# Patient Record
Sex: Female | Born: 1970 | Race: White | Hispanic: No | Smoking: Current every day smoker
Health system: Southern US, Community
[De-identification: ages and names within clinical notes are randomized; demographics above are authoritative.]

## PROBLEM LIST (undated history)

## (undated) DIAGNOSIS — N809 Endometriosis, unspecified: Secondary | ICD-10-CM

## (undated) DIAGNOSIS — C801 Malignant (primary) neoplasm, unspecified: Secondary | ICD-10-CM

## (undated) DIAGNOSIS — C539 Malignant neoplasm of cervix uteri, unspecified: Secondary | ICD-10-CM

## (undated) DIAGNOSIS — J45909 Unspecified asthma, uncomplicated: Secondary | ICD-10-CM

## (undated) DIAGNOSIS — O009 Unspecified ectopic pregnancy without intrauterine pregnancy: Secondary | ICD-10-CM

## (undated) DIAGNOSIS — D219 Benign neoplasm of connective and other soft tissue, unspecified: Secondary | ICD-10-CM

## (undated) DIAGNOSIS — T8859XA Other complications of anesthesia, initial encounter: Secondary | ICD-10-CM

## (undated) HISTORY — PX: CARPAL TUNNEL RELEASE: SHX101

## (undated) HISTORY — PX: TONSILLECTOMY: SUR1361

---

## 2012-10-12 ENCOUNTER — Emergency Department (HOSPITAL_COMMUNITY)
Admission: EM | Admit: 2012-10-12 | Discharge: 2012-10-12 | Disposition: A | Discharge status: Home / Self Care | Payer: Self-pay | Attending: Emergency Medicine | Admitting: Emergency Medicine

## 2012-10-12 ENCOUNTER — Emergency Department (HOSPITAL_COMMUNITY): Payer: Self-pay

## 2012-10-12 ENCOUNTER — Encounter (HOSPITAL_COMMUNITY): Payer: Self-pay | Admitting: Vascular Surgery

## 2012-10-12 DIAGNOSIS — Y9329 Activity, other involving ice and snow: Secondary | ICD-10-CM | POA: Insufficient documentation

## 2012-10-12 DIAGNOSIS — M47816 Spondylosis without myelopathy or radiculopathy, lumbar region: Secondary | ICD-10-CM

## 2012-10-12 DIAGNOSIS — J45909 Unspecified asthma, uncomplicated: Secondary | ICD-10-CM | POA: Insufficient documentation

## 2012-10-12 DIAGNOSIS — Z87891 Personal history of nicotine dependence: Secondary | ICD-10-CM | POA: Insufficient documentation

## 2012-10-12 DIAGNOSIS — M51379 Other intervertebral disc degeneration, lumbosacral region without mention of lumbar back pain or lower extremity pain: Secondary | ICD-10-CM | POA: Insufficient documentation

## 2012-10-12 DIAGNOSIS — M5137 Other intervertebral disc degeneration, lumbosacral region: Secondary | ICD-10-CM | POA: Insufficient documentation

## 2012-10-12 DIAGNOSIS — Y929 Unspecified place or not applicable: Secondary | ICD-10-CM | POA: Insufficient documentation

## 2012-10-12 DIAGNOSIS — W010XXA Fall on same level from slipping, tripping and stumbling without subsequent striking against object, initial encounter: Secondary | ICD-10-CM | POA: Insufficient documentation

## 2012-10-12 DIAGNOSIS — W108XXA Fall (on) (from) other stairs and steps, initial encounter: Secondary | ICD-10-CM | POA: Insufficient documentation

## 2012-10-12 DIAGNOSIS — IMO0002 Reserved for concepts with insufficient information to code with codable children: Secondary | ICD-10-CM | POA: Insufficient documentation

## 2012-10-12 HISTORY — DX: Unspecified asthma, uncomplicated: J45.909

## 2012-10-12 HISTORY — DX: Unspecified ectopic pregnancy without intrauterine pregnancy: O00.90

## 2012-10-12 MED ORDER — CYCLOBENZAPRINE HCL 10 MG PO TABS: 10.0000 mg | ORAL_TABLET | Freq: Two times a day (BID) | ORAL | Status: DC | PRN

## 2012-10-12 MED ORDER — NAPROXEN 500 MG PO TABS: 500.0000 mg | ORAL_TABLET | Freq: Two times a day (BID) | ORAL | Status: DC

## 2012-10-12 MED ORDER — HYDROMORPHONE HCL PF 1 MG/ML IJ SOLN
1.0000 mg | Freq: Once | INTRAMUSCULAR | Status: AC
  Administered 2012-10-12: 1 mg via INTRAVENOUS
  Filled 2012-10-12: qty 1

## 2012-10-12 MED ORDER — ONDANSETRON HCL 4 MG/2ML IJ SOLN
4.0000 mg | Freq: Once | INTRAMUSCULAR | Status: AC
  Administered 2012-10-12: 4 mg via INTRAVENOUS
  Filled 2012-10-12: qty 2

## 2012-10-12 MED ORDER — HYDROCODONE-ACETAMINOPHEN 5-325 MG PO TABS: 1.0000 | ORAL_TABLET | Freq: Four times a day (QID) | ORAL | Status: DC | PRN

## 2012-10-12 NOTE — ED Notes (Addendum)
Pt reports to the ED via [facility] for lower back and upper left leg pain following a fall down 8 stairs. Pt reports the pain radiates from her lower back into bilateral legs. Pt also reports numbness and tingling in the right leg. Pt denies head injury, syncope, or LOC. Pt reports her left leg was bent backwards. CMS and full ROM intact. A&O x4. Pt reports 10/10 pain. Pt appears tearful and anxious. Pt also reports some intermittent left hand numbness. Pt reports that she has hx of carpal tunnel and she has had problems with that before.

## 2012-10-12 NOTE — ED Notes (Signed)
Pt taken to xray 

## 2012-10-12 NOTE — ED Provider Notes (Signed)
History     CSN: [id number]  Arrival date & time 10/12/12  1102   First MD Initiated Contact with Patient 10/12/12 1105      Chief Complaint  Patient presents with  . Fall    (Consider location/radiation/quality/duration/timing/severity/associated sxs/prior treatment) HPI 42 yo F presents to ED via EMS following a slip on ice and fall down 8 stairs that occurred this AM outside of her mother's home. She reports hitting her lower and mid back on the stairs as well as hyperflexion of her L knee. She denies head trauma and loss on consciousness. Her low back pain is L sided, severe, non radiating. Worse with hip flexion. He knee pain is lateral, moderate, worse with palpation. She admits to numbness on the lateral aspect of the distal L toe. She denies groin numbness.   She denies neck pain. She admits to intermittent L hand numbness. She has a history of carpal tunnel and has experienced this numbness before.   Past Medical History  Diagnosis Date  . Asthma   . Ectopic pregnancy    Past Surgical History  Procedure Laterality Date  . Carpal tunnel release     History reviewed. No pertinent family history.  History  Substance Use Topics  . Smoking status: Former Games developer  . Smokeless tobacco: Not on file  . Alcohol Use: Yes     Comment: occasionally   OB History   Grav Para Term Preterm Abortions TAB SAB Ect Mult Living                 Review of Systems Negative except as per HPI.  Allergies  Codeine  Home Medications   Current Outpatient Rx  Name  Route  Sig  Dispense  Refill  . ketotifen (ALLERGY EYE DROPS) 0.025 % ophthalmic solution   Both Eyes   Place 1 drop into both eyes 2 (two) times daily as needed (for allergies).           BP 128/84  Pulse 71  Temp(Src) 98.3 F (36.8 C) (Oral)  Resp 20  SpO2 100%  LMP 10/05/2012  Physical Exam General appearance: alert, cooperative and mild distress Back:  Back Exam: Inspection: Motion: SLR lying: - XSLR  lying: -  Seated HS Flexibility: full  Palpable tenderness:  FABER: + on L, negative on R  Sensory change: numbness R lateral distal toe only Reflex change:   Strength at L foot Plantar-flexion:  5/ 5    Dorsi-flexion:  5/ 5    Eversion: 5/ 5   Inversion: 5 / 5 Leg strength Quad: 5 / 5   Hamstring:  5/ 5   Hip flexor:  5/ 5   Hip abductors: 5 / 5  Strength at R foot Plantar-flexion:  5/ 5    Dorsi-flexion:  5/ 5    Eversion: 5/ 5   Inversion: 5 / 5 Leg strength Quad: 5 / 5   Hamstring:  5/ 5   Hip flexor:  5/ 5   Hip abductors: 5 / 5  Gait Walking:          Heels:           Toes:            Tandem:  L Knee: noticeable swelling along anterior lateral quadricep and    ED Course  Procedures (including critical care time)  Labs Reviewed - No data to display Dg Lumbar Spine Complete  10/12/2012  *RADIOLOGY REPORT*  Clinical Data: Pain post trauma  LUMBAR SPINE - COMPLETE 4+ VIEW  Comparison: None.  Findings:  Frontal, lateral, spot lumbosacral lateral, bilateral oblique views were obtained.  There are five non-rib bearing lumbar type vertebral bodies. There is mild lower lumbar levorotoscoliosis.  There is no fracture or spondylolisthesis. There is marked disc space narrowing at L4-5 and L5-S1.  There is milder narrowing at L3-L4.  There is facet osteoarthritic change at L4-5 and L5-S1 bilaterally.  IMPRESSION: Osteoarthritic change.  No fracture or spondylolisthesis. Mild lower lumbar scoliosis.   Original Report Authenticated By: Bretta Bang, M.D.    Dg Knee Complete 4 Views Left  10/12/2012  *RADIOLOGY REPORT*  Clinical Data: Status post fall.  Pain.  LEFT KNEE - COMPLETE 4+ VIEW  Comparison: Plain films 04/15/2007.  Findings: Imaged bones, joints and soft tissues appear normal.  IMPRESSION: Normal study.   Original Report Authenticated By: Holley Dexter, M.D.     Reviewed x-rays with the patient.   No diagnosis found.   MDM   Fall on ice. Low back and L thigh  contusion. No fracture or dislocation.  Discharge to home with vicodin, NSAID and muscle relaxer for pain control. Also advised regular icing and use of cushion.  Recommended patient establish primary care near home.          Dessa Phi, MD 10/12/12 1418

## 2012-10-12 NOTE — ED Provider Notes (Signed)
Medical screening examination/treatment/procedure(s) were performed by non-physician practitioner and as supervising physician I was immediately available for consultation/collaboration.   Carleene Cooper III, MD 10/12/12 2008

## 2017-06-05 ENCOUNTER — Encounter (HOSPITAL_COMMUNITY): Payer: Self-pay

## 2017-06-05 ENCOUNTER — Inpatient Hospital Stay (HOSPITAL_COMMUNITY)
Admission: AD | Admit: 2017-06-05 | Discharge: 2017-06-05 | Disposition: A | Discharge status: Home / Self Care | Payer: Self-pay | Source: Ambulatory Visit | Attending: Obstetrics & Gynecology | Admitting: Obstetrics & Gynecology

## 2017-06-05 DIAGNOSIS — D500 Iron deficiency anemia secondary to blood loss (chronic): Secondary | ICD-10-CM | POA: Insufficient documentation

## 2017-06-05 DIAGNOSIS — Z8541 Personal history of malignant neoplasm of cervix uteri: Secondary | ICD-10-CM | POA: Insufficient documentation

## 2017-06-05 DIAGNOSIS — D649 Anemia, unspecified: Secondary | ICD-10-CM | POA: Diagnosis present

## 2017-06-05 DIAGNOSIS — N939 Abnormal uterine and vaginal bleeding, unspecified: Secondary | ICD-10-CM | POA: Diagnosis present

## 2017-06-05 DIAGNOSIS — Z3202 Encounter for pregnancy test, result negative: Secondary | ICD-10-CM | POA: Insufficient documentation

## 2017-06-05 DIAGNOSIS — Z87891 Personal history of nicotine dependence: Secondary | ICD-10-CM | POA: Insufficient documentation

## 2017-06-05 HISTORY — DX: Benign neoplasm of connective and other soft tissue, unspecified: D21.9

## 2017-06-05 HISTORY — DX: Endometriosis, unspecified: N80.9

## 2017-06-05 HISTORY — DX: Malignant (primary) neoplasm, unspecified: C80.1

## 2017-06-05 LAB — URINALYSIS, ROUTINE W REFLEX MICROSCOPIC
BILIRUBIN URINE: NEGATIVE
Glucose, UA: NEGATIVE mg/dL
Ketones, ur: NEGATIVE mg/dL
NITRITE: POSITIVE — AB
Protein, ur: 100 mg/dL — AB
Specific Gravity, Urine: <1.005 — ABNORMAL LOW (ref 1.005–1.030)
pH: 8.5 — ABNORMAL HIGH (ref 5.0–8.0)

## 2017-06-05 LAB — CBC
HCT: 23.1 % — ABNORMAL LOW (ref 36.0–46.0)
Hemoglobin: 7.4 g/dL — ABNORMAL LOW (ref 12.0–15.0)
MCH: 26.1 pg (ref 26.0–34.0)
MCHC: 32 g/dL (ref 30.0–36.0)
MCV: 81.3 fL (ref 78.0–100.0)
PLATELETS: 317 10*3/uL (ref 150–400)
RBC: 2.84 MIL/uL — ABNORMAL LOW (ref 3.87–5.11)
RDW: 21.1 % — AB (ref 11.5–15.5)
WBC: 9.4 10*3/uL (ref 4.0–10.5)

## 2017-06-05 LAB — URINALYSIS, MICROSCOPIC (REFLEX)
BACTERIA UA: NONE SEEN
WBC UA: NONE SEEN WBC/hpf (ref 0–5)

## 2017-06-05 LAB — WET PREP, GENITAL
CLUE CELLS WET PREP: NONE SEEN
Sperm: NONE SEEN
TRICH WET PREP: NONE SEEN
WBC, Wet Prep HPF POC: NONE SEEN
YEAST WET PREP: NONE SEEN

## 2017-06-05 LAB — POCT PREGNANCY, URINE: Preg Test, Ur: NEGATIVE

## 2017-06-05 MED ORDER — MEGESTROL ACETATE 40 MG PO TABS: 40.0000 mg | ORAL_TABLET | Freq: Two times a day (BID) | ORAL | 0 refills | Status: DC

## 2017-06-05 MED ORDER — DIPHENHYDRAMINE HCL 50 MG/ML IJ SOLN
25.0000 mg | INTRAMUSCULAR | Status: AC
  Administered 2017-06-05: 25 mg via INTRAVENOUS
  Filled 2017-06-05: qty 1

## 2017-06-05 MED ORDER — METOCLOPRAMIDE HCL 5 MG/ML IJ SOLN
10.0000 mg | Freq: Once | INTRAMUSCULAR | Status: AC
  Administered 2017-06-05: 10 mg via INTRAVENOUS
  Filled 2017-06-05: qty 2

## 2017-06-05 MED ORDER — SODIUM CHLORIDE 0.9 % IV SOLN
INTRAVENOUS | Status: DC
  Administered 2017-06-05: 20:00:00 via INTRAVENOUS

## 2017-06-05 MED ORDER — FERROUS SULFATE 325 (65 FE) MG PO TBEC: 325.0000 mg | DELAYED_RELEASE_TABLET | Freq: Three times a day (TID) | ORAL | 0 refills | Status: DC

## 2017-06-05 MED ORDER — DEXAMETHASONE SODIUM PHOSPHATE 10 MG/ML IJ SOLN
10.0000 mg | INTRAMUSCULAR | Status: AC
  Administered 2017-06-05: 10 mg via INTRAVENOUS
  Filled 2017-06-05: qty 1

## 2017-06-05 MED ORDER — SODIUM CHLORIDE 0.9 % IV SOLN
510.0000 mg | Freq: Once | INTRAVENOUS | Status: AC
  Administered 2017-06-05: 510 mg via INTRAVENOUS
  Filled 2017-06-05: qty 17

## 2017-06-05 MED ORDER — LACTATED RINGERS IV BOLUS (SEPSIS)
1000.0000 mL | Freq: Once | INTRAVENOUS | Status: DC
  Administered 2017-06-05: 1000 mL via INTRAVENOUS

## 2017-06-05 NOTE — MAU Provider Note (Signed)
History      CSN: [id number]  Arrival date & time 06/05/17  1652   First Provider Initiated Contact with Patient 06/05/17 1736      Chief Complaint  Patient presents with  . Vaginal Bleeding    Pt is a 46 yo G5P0030 with a PMH of Endometriosis, fibroid and cervical cancer who presents with complaints of vaginal bleeding and headache. Pt states she has been bleeding for 10 days. She describes the blood as bright red and states she has passed "fist-sized" clots. She has also had associated headache in the back of her head that radiates to the back of her eyes. She rates the headache 10/10 on the pain scale with associated blurry vision. Pt endorses dizziness, urinary frequency, SOB and some palpitations but denies any CP, abd pain, vaginal discharge, dysuria, or edema. Of note, pt reports that she has had similar episodes of these symptoms at [facility] in Jan, Feb and March. She had another episode in Sept. at [facility]. She was giving blood transfusion each time.     Past Medical History:  Diagnosis Date  . Asthma   . Cancer (HCC)    cervical  . Ectopic pregnancy   . Endometriosis   . Fibroid     Past Surgical History:  Procedure Laterality Date  . CARPAL TUNNEL RELEASE      History reviewed. No pertinent family history.  Social History   Tobacco Use  . Smoking status: Former Research scientist (life sciences)  . Smokeless tobacco: Never Used  Substance Use Topics  . Alcohol use: Yes    Comment: occasionally  . Drug use: No    OB History    Gravida Para Term Preterm AB Living   5       3     SAB TAB Ectopic Multiple Live Births   1 1 1           Review of Systems  Constitutional: Positive for activity change. Negative for appetite change, chills and fever.  HENT: Negative for congestion.   Eyes: Positive for photophobia, pain and visual disturbance. Negative for discharge.  Respiratory: Positive for shortness of breath. Negative for apnea, chest tightness and wheezing.   Cardiovascular:  Positive for palpitations. Negative for chest pain and leg swelling.  Gastrointestinal: Negative for abdominal distention, abdominal pain, blood in stool, nausea and vomiting.  Genitourinary: Positive for frequency and vaginal bleeding. Negative for difficulty urinating, dysuria and vaginal discharge.  Musculoskeletal: Negative for arthralgias, back pain and myalgias.  Skin: Negative for color change and rash.  Neurological: Positive for dizziness and headaches. Negative for syncope and weakness.  Psychiatric/Behavioral: Negative for agitation and confusion.    Allergies  Codeine  Home Medications    BP (!) 100/58   Pulse 64   Temp 99.1 F (37.3 C)   Resp 19   LMP 05/22/2017   SpO2 95%   Physical Exam  Constitutional: She is oriented to person, place, and time. She appears well-developed. No distress.  HENT:  Head: Normocephalic and atraumatic.  Eyes: Conjunctivae are normal. No scleral icterus.  Neck: Normal range of motion. Neck supple.  Cardiovascular: Normal rate, regular rhythm, normal heart sounds and intact distal pulses. Exam reveals no gallop and no friction rub.  No murmur heard. Pulmonary/Chest: Effort normal and breath sounds normal. She has no wheezes.  Abdominal: Soft. Bowel sounds are normal. She exhibits no distension. There is no tenderness.  Genitourinary: Vagina normal and uterus normal. No vaginal discharge found.  Musculoskeletal: Normal range  of motion. She exhibits no tenderness.  Neurological: She is alert and oriented to person, place, and time.  Skin: Skin is dry. No rash noted. There is pallor.  Psychiatric: She has a normal mood and affect.    MAU Course  Procedures (including critical care time) Results for orders placed or performed during the hospital encounter of 06/05/17 (from the past 24 hour(s))  Urinalysis, Routine w reflex microscopic     Status: Abnormal   Collection Time: 06/05/17  5:15 PM  Result Value Ref Range   Color, Urine RED  (A) YELLOW   APPearance CLEAR CLEAR   Specific Gravity, Urine <1.005 (L) 1.005 - 1.030   pH 8.5 (H) 5.0 - 8.0   Glucose, UA NEGATIVE NEGATIVE mg/dL   Hgb urine dipstick LARGE (A) NEGATIVE   Bilirubin Urine NEGATIVE NEGATIVE   Ketones, ur NEGATIVE NEGATIVE mg/dL   Protein, ur 100 (A) NEGATIVE mg/dL   Nitrite POSITIVE (A) NEGATIVE   Leukocytes, UA TRACE (A) NEGATIVE  Urinalysis, Microscopic (reflex)     Status: Abnormal   Collection Time: 06/05/17  5:15 PM  Result Value Ref Range   RBC / HPF 6-30 0 - 5 RBC/hpf   WBC, UA NONE SEEN 0 - 5 WBC/hpf   Bacteria, UA NONE SEEN NONE SEEN   Squamous Epithelial / LPF 0-5 (A) NONE SEEN   Urine-Other MICROSCOPIC EXAM PERFORMED ON UNCONCENTRATED URINE   Pregnancy, urine POC     Status: None   Collection Time: 06/05/17  5:28 PM  Result Value Ref Range   Preg Test, Ur NEGATIVE NEGATIVE  Wet prep, genital     Status: None   Collection Time: 06/05/17  6:11 PM  Result Value Ref Range   Yeast Wet Prep HPF POC NONE SEEN NONE SEEN   Trich, Wet Prep NONE SEEN NONE SEEN   Clue Cells Wet Prep HPF POC NONE SEEN NONE SEEN   WBC, Wet Prep HPF POC NONE SEEN NONE SEEN   Sperm NONE SEEN   CBC     Status: Abnormal   Collection Time: 06/05/17  6:24 PM  Result Value Ref Range   WBC 9.4 4.0 - 10.5 K/uL   RBC 2.84 (L) 3.87 - 5.11 MIL/uL   Hemoglobin 7.4 (L) 12.0 - 15.0 g/dL   HCT 23.1 (L) 36.0 - 46.0 %   MCV 81.3 78.0 - 100.0 fL   MCH 26.1 26.0 - 34.0 pg   MCHC 32.0 30.0 - 36.0 g/dL   RDW 21.1 (H) 11.5 - 15.5 %   Platelets 317 150 - 400 K/uL     Labs Reviewed  URINALYSIS, ROUTINE W REFLEX MICROSCOPIC - Abnormal; Notable for the following components:      Result Value   Color, Urine RED (*)    Specific Gravity, Urine <1.005 (*)    pH 8.5 (*)    Hgb urine dipstick LARGE (*)    Protein, ur 100 (*)    Nitrite POSITIVE (*)    Leukocytes, UA TRACE (*)    All other components within normal limits  URINALYSIS, MICROSCOPIC (REFLEX) - Abnormal; Notable for  the following components:   Squamous Epithelial / LPF 0-5 (*)    All other components within normal limits  CBC - Abnormal; Notable for the following components:   RBC 2.84 (*)    Hemoglobin 7.4 (*)    HCT 23.1 (*)    RDW 21.1 (*)    All other components within normal limits  WET PREP, GENITAL  HIV  ANTIBODY (ROUTINE TESTING)  POCT PREGNANCY, URINE  GC/CHLAMYDIA PROBE AMP ([facility]) NOT AT [facility]   No results found.   1. Abnormal uterine bleeding (AUB)   2. Iron deficiency anemia due to chronic blood loss       MDM  Pt w/ hx of fibroids, endometriosis and cervical and cervical cancer Hx of vaginal bleeding treated with blood transfusion IV headache cocktail improving pt's headache Small to moderate amount of blood on pelvic exam UA pos for UTI, plan to give antibiotics to treat at home, urine preg test neg. Wet prep neg, cultures collected for GC/chlamydia and HIV testing CBC showed hgb of 7.4, hct of 23.1, will give Feraheme infusion here Plan to discharge with Rx for iron pills TID.    Assessment/Plan:  Abnormal uterine bleeding (AUB) - Rx for Megace 80 mg po BID x 3 days, then 40 mg BID until bleeding stops - F/U with GYN provider in [place], [place] ASAP - Return to nearest hospital, if sx's worsen - Information provided on AUB   Iron deficiency anemia due to chronic blood loss  - Rx for FeSO4 po TID - Information provided on iron rich diet  Discharge home Patient verbalized an understanding of the plan of care and agrees.   Linwood Dibbles  Medical Student 06/05/2017 6:50 PM   I confirm that I have verified the information documented in the medical student's note and that I have also personally reperformed the physical exam and all medical decision making activities.   Laury Deep, CNM 06/05/2017 9:18 PM

## 2017-06-05 NOTE — MAU Note (Signed)
Pt arrives via EMS with complaint of heavy vaginal bleeding x 2 weeks, states she has a history of endometriosis, fibroids, and cervical cancer. States she has a history of heavy bleeding. Reports a severe headache x 2 days.

## 2017-06-05 NOTE — MAU Note (Addendum)
Additional urine collected from patient and sent to lab

## 2017-06-06 LAB — HIV ANTIBODY (ROUTINE TESTING W REFLEX): HIV Screen 4th Generation wRfx: NONREACTIVE

## 2017-06-08 LAB — GC/CHLAMYDIA PROBE AMP (~~LOC~~) NOT AT ARMC
Chlamydia: NEGATIVE
Neisseria Gonorrhea: NEGATIVE

## 2019-12-05 ENCOUNTER — Other Ambulatory Visit: Payer: Self-pay | Admitting: Family Medicine

## 2019-12-05 ENCOUNTER — Other Ambulatory Visit (HOSPITAL_COMMUNITY): Payer: Self-pay | Admitting: Family Medicine

## 2019-12-05 DIAGNOSIS — N838 Other noninflammatory disorders of ovary, fallopian tube and broad ligament: Secondary | ICD-10-CM

## 2019-12-21 ENCOUNTER — Encounter: Payer: Self-pay | Admitting: Allergy & Immunology

## 2019-12-21 ENCOUNTER — Ambulatory Visit (HOSPITAL_COMMUNITY)
Admission: RE | Admit: 2019-12-21 | Discharge: 2019-12-21 | Disposition: A | Discharge status: Home / Self Care | Payer: 59 | Source: Ambulatory Visit | Attending: Family Medicine | Admitting: Family Medicine

## 2019-12-21 ENCOUNTER — Ambulatory Visit (INDEPENDENT_AMBULATORY_CARE_PROVIDER_SITE_OTHER): Payer: 59 | Admitting: Allergy & Immunology

## 2019-12-21 ENCOUNTER — Other Ambulatory Visit: Payer: Self-pay

## 2019-12-21 VITALS — BP 130/74 | HR 82 | Temp 97.4°F | Resp 16 | Ht 61.3 in | Wt 153.4 lb

## 2019-12-21 DIAGNOSIS — R059 Cough, unspecified: Secondary | ICD-10-CM

## 2019-12-21 DIAGNOSIS — J302 Other seasonal allergic rhinitis: Secondary | ICD-10-CM | POA: Diagnosis not present

## 2019-12-21 DIAGNOSIS — T485X5D Adverse effect of other anti-common-cold drugs, subsequent encounter: Secondary | ICD-10-CM

## 2019-12-21 DIAGNOSIS — J310 Chronic rhinitis: Secondary | ICD-10-CM

## 2019-12-21 DIAGNOSIS — N838 Other noninflammatory disorders of ovary, fallopian tube and broad ligament: Secondary | ICD-10-CM | POA: Diagnosis present

## 2019-12-21 DIAGNOSIS — J3089 Other allergic rhinitis: Secondary | ICD-10-CM | POA: Diagnosis not present

## 2019-12-21 DIAGNOSIS — R05 Cough: Secondary | ICD-10-CM | POA: Diagnosis not present

## 2019-12-21 DIAGNOSIS — T485X5A Adverse effect of other anti-common-cold drugs, initial encounter: Secondary | ICD-10-CM

## 2019-12-21 MED ORDER — XHANCE 93 MCG/ACT NA EXHU: 1.0000 | INHALANT_SUSPENSION | Freq: Two times a day (BID) | NASAL | 5 refills | Status: DC

## 2019-12-21 MED ORDER — AZELASTINE HCL 0.15 % NA SOLN: 1.0000 | Freq: Two times a day (BID) | NASAL | 5 refills | Status: DC

## 2019-12-21 MED ORDER — LEVOCETIRIZINE DIHYDROCHLORIDE 5 MG PO TABS: 5.0000 mg | ORAL_TABLET | Freq: Every evening | ORAL | 5 refills | Status: DC

## 2019-12-21 NOTE — Patient Instructions (Signed)
1. 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 - We need to get you off of this medication, so decreased to 1 spray per nostril daily for 2 weeks, 1 spray per nostril every other day for 2 weeks, and then stop. - Start the prednisone Dosepak provided. - Stop the fluticasone and start XHANCE 1 spray per nostril twice daily. - Start Astelin 1 spray per nostril twice daily. - Add on Xyzal 5mg  daily. - Testing today showed: grasses, weeds, indoor molds and outdoor molds - Copy of test results provided.  - Avoidance measures provided. - You can use an extra dose of the antihistamine, if needed, for breakthrough symptoms.  - Consider nasal saline rinses 1-2 times daily to remove allergens from the nasal cavities as well as help with mucous clearance (this is especially helpful to do before the nasal sprays are given) - Consider allergy shots as a means of long-term control. - Allergy shots "re-train" and "reset" the immune system to ignore environmental allergens and decrease the resulting immune response to those allergens (sneezing, itchy watery eyes, runny nose, nasal congestion, etc).    - Allergy shots improve symptoms in 75-85% of patients.  - We can discuss more at the next appointment if the medications are not working for you.   2. Cough - Lung testing looked great today. - We are not going to add anything at this time.  3. No follow-ups on file. This can be an in-person, a virtual Webex or a telephone follow up visit.   Please inform us of any Emergency Department visits, hospitalizations, or changes in symptoms. Call us before going to the ED for breathing or allergy symptoms since we might be able to fit you in for a sick visit. Feel free to contact us anytime  with any questions, problems, or concerns.  It was a pleasure to meet you today!  Websites that have reliable patient information: 1. American Academy of Asthma, Allergy, and Immunology: www.aaaai.org 2. Food Allergy Research and Education (FARE): foodallergy.org 3. Mothers of Asthmatics: http://www.asthmacommunitynetwork.org 4. American College of Allergy, Asthma, and Immunology: www.acaai.org   COVID-19 Vaccine Information can be found at: ShippingScam.co.uk For questions related to vaccine distribution or appointments, please email [email or web address] or call [phone].     "Like" [place] on Facebook and Instagram for our latest updates!       HAPPY SPRING!  Make sure you are registered to vote! If you have moved or changed any of your contact information, you will need to get this updated before voting!  In some cases, you MAY be able to register to vote online: [email or web address]     Reducing Pollen Exposure  The American Academy of Allergy, Asthma and Immunology suggests the following steps to reduce your exposure to pollen during allergy seasons.    1. Do not hang sheets or clothing out to dry; pollen may collect on these items. 2. Do not mow lawns or spend time around freshly cut grass; mowing stirs up pollen. 3. Keep windows closed at night.  Keep car windows closed while driving. 4. Minimize morning activities outdoors, a time when pollen counts are usually at their highest. 5. Stay indoors as much as possible when pollen counts or humidity is high and on windy days when pollen tends to remain in the air  longer. 6. Use air conditioning when possible.  Many air conditioners have filters that trap the pollen spores. 7. Use a HEPA room air filter to remove pollen form the indoor air you breathe.  Control of Mold Allergen   Mold and fungi can grow on a variety of surfaces provided  certain temperature and moisture conditions exist.  Outdoor molds grow on plants, decaying vegetation and soil.  The major outdoor mold, Alternaria and Cladosporium, are found in very high numbers during hot and dry conditions.  Generally, a late Summer - Fall peak is seen for common outdoor fungal spores.  Rain will temporarily lower outdoor mold spore count, but counts rise rapidly when the rainy period ends.  The most important indoor molds are Aspergillus and Penicillium.  Dark, humid and poorly ventilated basements are ideal sites for mold growth.  The next most common sites of mold growth are the bathroom and the kitchen.  Outdoor (Seasonal) Mold Control  Positive outdoor molds via skin testing: Alternaria, Cladosporium, Bipolaris (Helminthsporium), Drechslera (Curvalaria) and Mucor  1. Use air conditioning and keep windows closed 2. Avoid exposure to decaying vegetation. 3. Avoid leaf raking. 4. Avoid grain handling. 5. Consider wearing a face mask if working in moldy areas.  6.   Indoor (Perennial) Mold Control   Positive indoor molds via skin testing: Aspergillus, Penicillium, Fusarium, Aureobasidium (Pullulara) and Rhizopus  1. Maintain humidity below 50%. 2. Clean washable surfaces with 5% bleach solution. 3. Remove sources e.g. contaminated carpets.   Allergy Shots   Allergies are the result of a chain reaction that starts in the immune system. Your immune system controls how your body defends itself. For instance, if you have an allergy to pollen, your immune system identifies pollen as an invader or allergen. Your immune system overreacts by producing antibodies called Immunoglobulin E (IgE). These antibodies travel to cells that release chemicals, causing an allergic reaction.  The concept behind allergy immunotherapy, whether it is received in the form of shots or tablets, is that the immune system can be desensitized to specific allergens that trigger allergy symptoms.  Although it requires time and patience, the payback can be long-term relief.  How Do Allergy Shots Work?  Allergy shots work much like a vaccine. Your body responds to injected amounts of a particular allergen given in increasing doses, eventually developing a resistance and tolerance to it. Allergy shots can lead to decreased, minimal or no allergy symptoms.  There generally are two phases: build-up and maintenance. Build-up often ranges from three to six months and involves receiving injections with increasing amounts of the allergens. The shots are typically given once or twice a week, though more rapid build-up schedules are sometimes used.  The maintenance phase begins when the most effective dose is reached. This dose is different for each person, depending on how allergic you are and your response to the build-up injections. Once the maintenance dose is reached, there are longer periods between injections, typically two to four weeks.  Occasionally doctors give cortisone-type shots that can temporarily reduce allergy symptoms. These types of shots are different and should not be confused with allergy immunotherapy shots.  Who Can Be Treated with Allergy Shots?  Allergy shots may be a good treatment approach for people with allergic rhinitis (hay fever), allergic asthma, conjunctivitis (eye allergy) or stinging insect allergy.   Before deciding to begin allergy shots, you should consider:  . The length of allergy season and the severity of your symptoms . Whether medications  and/or changes to your environment can control your symptoms . Your desire to avoid long-term medication use . Time: allergy immunotherapy requires a major time commitment . Cost: may vary depending on your insurance coverage  Allergy shots for children age 72 and older are effective and often well tolerated. They might prevent the onset of new allergen sensitivities or the progression to asthma.  Allergy shots  are not started on patients who are pregnant but can be continued on patients who become pregnant while receiving them. In some patients with other medical conditions or who take certain common medications, allergy shots may be of risk. It is important to mention other medications you talk to your allergist.   When Will I Feel Better?  Some may experience decreased allergy symptoms during the build-up phase. For others, it may take as long as 12 months on the maintenance dose. If there is no improvement after a year of maintenance, your allergist will discuss other treatment options with you.  If you aren't responding to allergy shots, it may be because there is not enough dose of the allergen in your vaccine or there are missing allergens that were not identified during your allergy testing. Other reasons could be that there are high levels of the allergen in your environment or major exposure to non-allergic triggers like tobacco smoke.  What Is the Length of Treatment?  Once the maintenance dose is reached, allergy shots are generally continued for three to five years. The decision to stop should be discussed with your allergist at that time. Some people may experience a permanent reduction of allergy symptoms. Others may relapse and a longer course of allergy shots can be considered.  What Are the Possible Reactions?  The two types of adverse reactions that can occur with allergy shots are local and systemic. Common local reactions include very mild redness and swelling at the injection site, which can happen immediately or several hours after. A systemic reaction, which is less common, affects the entire body or a particular body system. They are usually mild and typically respond quickly to medications. Signs include increased allergy symptoms such as sneezing, a stuffy nose or hives.  Rarely, a serious systemic reaction called anaphylaxis can develop. Symptoms include swelling in the throat,  wheezing, a feeling of tightness in the chest, nausea or dizziness. Most serious systemic reactions develop within 30 minutes of allergy shots. This is why it is strongly recommended you wait in your doctor's office for 30 minutes after your injections. Your allergist is trained to watch for reactions, and his or her staff is trained and equipped with the proper medications to identify and treat them.  Who Should Administer Allergy Shots?  The preferred location for receiving shots is your prescribing allergist's office. Injections can sometimes be given at another facility where the physician and staff are trained to recognize and treat reactions, and have received instructions by your prescribing allergist.

## 2019-12-21 NOTE — Progress Notes (Signed)
NEW PATIENT  Date of Service/Encounter:  12/21/19  Referring provider: Jolinda Croak, MD   Assessment:   Cough - likely from postnasal drip   Rhinitis medicamentosa   Perennial and seasonal allergic rhinitis (grasses, weeds, indoor molds and outdoor molds)  Plan/Recommendations:   1. 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 - We need to get you off of this medication, so decreased to 1 spray per nostril daily for 2 weeks, 1 spray per nostril every other day for 2 weeks, and then stop. - Start the prednisone Dosepak provided. - Stop the fluticasone and start XHANCE 1 spray per nostril twice daily. - Start Astelin 1 spray per nostril twice daily. - Add on Xyzal 5mg  daily. - Testing today showed: grasses, weeds, indoor molds and outdoor molds - Copy of test results provided.  - Avoidance measures provided. - You can use an extra dose of the antihistamine, if needed, for breakthrough symptoms.  - Consider nasal saline rinses 1-2 times daily to remove allergens from the nasal cavities as well as help with mucous clearance (this is especially helpful to do before the nasal sprays are given) - Consider allergy shots as a means of long-term control. - Allergy shots "re-train" and "reset" the immune system to ignore environmental allergens and decrease the resulting immune response to those allergens (sneezing, itchy watery eyes, runny nose, nasal congestion, etc).    - Allergy shots improve symptoms in 75-85% of patients.  - We can discuss more at the next appointment if the medications are not working for you.   2. Cough - Lung testing looked great today. - We are not going to add anything at this time.  3. Return to clinic in 4 weeks or earlier if  needed.   Subjective:   Debbie Delgado is a 49 y.o. female presenting today for evaluation of  Chief Complaint  Patient presents with  . Allergies    Pollen cause shortness of breathe.   . Chronic Dry Eye    Debbie Delgado has a history of the following: Patient Active Problem List   Diagnosis Date Noted  . Anemia 06/05/2017  . Abnormal uterine bleeding (AUB) 06/05/2017    History obtained from: chart review and patient.  Debbie Delgado was referred by Jolinda Croak, MD.     Debbie Delgado is a 49 y.o. female presenting for an evaluation of environmental allergies and congestion.   Asthma/Respiratory Symptom History: She reports that she had asthma when she was a kid. She outgrew it however. She has been having problems with breathing through her mouth. She does not have an inhaler at this point.  Allergic Rhinitis Symptom History: She reports problems during the spring, summer, and fall. She has tried Mucinex nasal spray with minimal improvement. She was on Flonase without an improvement. Currently she is using this Mucinex nasal spray that contains oxymetazoline. She has used the [facility] for two years without improvement in her symptoms. She was alternating cetirizine and loratadine without improvement. She has never been on allergy shots and has never been tested at all. She does not get sinus infections normally, but she reports that her sinuses are "dried out".   She does have dry eyes. She is currently on restasis and she was on a prednisone steroid  for one month. She has not had blodo work and there is no history of autoimmunity. She has dried mouth as well as nose and eyes. She is constantly drinking water.   Otherwise, there is no history of other atopic diseases, including asthma, food allergies, drug allergies, stinging insect allergies, eczema, urticaria or contact dermatitis. There is no significant infectious history. Vaccinations are up to date.    Past Medical  History: Patient Active Problem List   Diagnosis Date Noted  . Anemia 06/05/2017  . Abnormal uterine bleeding (AUB) 06/05/2017    Medication List:  Allergies as of 12/21/2019      Reactions   Codeine Itching, Nausea And Vomiting   "can take with promethazine"      Medication List       Accurate as of Dec 21, 2019 11:59 PM. If you have any questions, ask your nurse or doctor.        STOP taking these medications   Allergy Eye Drops 0.025 % ophthalmic solution Generic drug: ketotifen Stopped by: Valentina Shaggy, MD   cyclobenzaprine 10 MG tablet Commonly known as: Flexeril Stopped by: Valentina Shaggy, MD   ferrous sulfate 325 (65 FE) MG EC tablet Stopped by: Valentina Shaggy, MD   HYDROcodone-acetaminophen 5-325 MG tablet Commonly known as: NORCO/VICODIN Stopped by: Valentina Shaggy, MD   megestrol 40 MG tablet Commonly known as: MEGACE Stopped by: Valentina Shaggy, MD   naproxen 500 MG tablet Commonly known as: NAPROSYN Stopped by: Valentina Shaggy, MD     TAKE these medications   Azelastine HCl 0.15 % Soln Place 1 spray into both nostrils 2 (two) times daily. Started by: Valentina Shaggy, MD   cycloSPORINE 0.05 % ophthalmic emulsion Commonly known as: RESTASIS Place 1 drop into both eyes every 12 (twelve) hours.   levocetirizine 5 MG tablet Commonly known as: XYZAL Take 1 tablet (5 mg total) by mouth every evening. Started by: Valentina Shaggy, MD   Cyril Loosen MCG/ACT Exhu Generic drug: Fluticasone Propionate Place 1 spray into the nose in the morning and at bedtime. Started by: Valentina Shaggy, MD       Birth History: non-contributory  Developmental History: non-contributory  Past Surgical History: Past Surgical History:  Procedure Laterality Date  . CARPAL TUNNEL RELEASE    . TONSILLECTOMY       Family History: Family History  Problem Relation Age of Onset  . Allergic rhinitis Sister   .  Allergic rhinitis Brother   . Angioedema Neg Hx   . Asthma Neg Hx   . Atopy Neg Hx   . Eczema Neg Hx   . Immunodeficiency Neg Hx   . Urticaria Neg Hx      Social History: Debbie Delgado lives at home in a trailer that was built in 1956. There is linoleum flooring in the main living areas as well as the bedroom.  She has oil for heating and window units for cooling.  There is a dog inside of the home.  There are no dust mite covers on the bedding.  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  She has done this for 9 years. She smokes half a pack per day since 1996.    Review of Systems  Constitutional: Negative.  Negative for chills, fever, malaise/fatigue and weight loss.  HENT: Positive for congestion and sinus pain. Negative for ear discharge and ear pain.   Eyes: Negative for pain, discharge and redness.  Respiratory:  Negative for cough, sputum production, shortness of breath and wheezing.   Cardiovascular: Negative.  Negative for chest pain and palpitations.  Gastrointestinal: Negative for abdominal pain, constipation, diarrhea, heartburn, nausea and vomiting.  Skin: Negative.  Negative for itching and rash.  Neurological: Negative for dizziness and headaches.  Endo/Heme/Allergies: Positive for environmental allergies. Does not bruise/bleed easily.       Objective:   Blood pressure 130/74, pulse 82, temperature (!) 97.4 F (36.3 C), temperature source Temporal, resp. rate 16, height 5' 1.3" (1.557 m), weight 153 lb 6.4 oz (69.6 kg), SpO2 99 %. Body mass index is 28.7 kg/m.   Physical Exam:   Physical Exam  Constitutional: She appears well-developed.  HENT:  Head: Normocephalic and atraumatic.  Right Ear: Tympanic membrane, external ear and ear canal normal. No drainage, swelling or tenderness. Tympanic membrane is not injected, not scarred, not erythematous, not retracted and not bulging.  Left Ear: Tympanic membrane, external ear and ear canal normal. No drainage,  swelling or tenderness. Tympanic membrane is not injected, not scarred, not erythematous, not retracted and not bulging.  Nose: Mucosal edema and rhinorrhea present. No nasal deformity or septal deviation. No epistaxis. Right sinus exhibits no maxillary sinus tenderness and no frontal sinus tenderness. Left sinus exhibits no maxillary sinus tenderness and no frontal sinus tenderness.  Mouth/Throat: Uvula is midline and oropharynx is clear and moist. Mucous membranes are not pale and not dry.  Turbinates enlarged bilaterally. Clear rhinorrhea from the bilateral turbinates.   Eyes: Pupils are equal, round, and reactive to light. Conjunctivae and EOM are normal. Right eye exhibits no chemosis and no discharge. Left eye exhibits no chemosis and no discharge. Right conjunctiva is not injected. Left conjunctiva is not injected.  Cardiovascular: Normal rate, regular rhythm and normal heart sounds.  Respiratory: Effort normal and breath sounds normal. No accessory muscle usage. No tachypnea. No respiratory distress. She has no wheezes. She has no rhonchi. She has no rales. She exhibits no tenderness.  Moving air well in all lung fields. No increased work of breathing noted.   GI: There is no abdominal tenderness. There is no rebound and no guarding.  Lymphadenopathy:       Head (right side): No submandibular, no tonsillar and no occipital adenopathy present.       Head (left side): No submandibular, no tonsillar and no occipital adenopathy present.    She has no cervical adenopathy.  Neurological: She is alert.  Skin: No abrasion, no petechiae and no rash noted. Rash is not papular, not vesicular and not urticarial. No erythema. No pallor.  Psychiatric: She has a normal mood and affect.     Diagnostic studies:    Spirometry: results normal (FEV1: 1.80/70%, FVC: 2.49/77%, FEV1/FVC: 72%).    Spirometry consistent with normal pattern.   Allergy Studies:    Airborne Adult Perc - 12/21/19 1436    Time  Antigen Placed  1436    Allergen Manufacturer  Lavella Delgado    Location  Back    Number of Test  59    Panel 1  Select    1. Control-Buffer 50% Glycerol  Negative    2. Control-Histamine 1 mg/ml  2+    3. Albumin saline  Negative    4. [place]  Negative    5. [place]  Negative    6. Johnson  Negative    7. [place] Blue  Negative    8. Meadow Fescue  Negative    9. Perennial Rye  Negative  10. Sweet Vernal  Negative    11. Timothy  Negative    12. Cocklebur  Negative    13. Burweed Marshelder  Negative    14. Ragweed, short  Negative    15. Ragweed, Giant  Negative    16. Plantain,  English  Negative    17. Lamb's Quarters  Negative    18. Sheep Sorrell  Negative    19. Rough Pigweed  Negative    20. Marsh Elder, Rough  Negative    21. Mugwort, Common  Negative    22. Ash mix  Negative    23. Birch mix  Negative    24. Beech American  Negative    25. Box, Elder  Negative    26. Cedar, red  Negative    27. Cottonwood, [place]  Negative    28. Elm mix  Negative    29. Hickory  Negative    30. Maple mix  Negative    31. Oak, [place] mix  Negative    32. Pecan Pollen  Negative    33. Pine mix  Negative    34. Sycamore Eastern  Negative    35. Vayas, Black Pollen  Negative    36. Alternaria alternata  Negative    37. Cladosporium Herbarum  Negative    38. Aspergillus mix  Negative    39. Penicillium mix  Negative    40. Bipolaris sorokiniana (Helminthosporium)  Negative    41. Drechslera spicifera (Curvularia)  Negative    42. Mucor plumbeus  Negative    43. Fusarium moniliforme  Negative    44. Aureobasidium pullulans (pullulara)  Negative    45. Rhizopus oryzae  Negative    46. Botrytis cinera  Negative    47. Epicoccum nigrum  Negative    48. Phoma betae  Negative    49. Candida Albicans  Negative    50. Trichophyton mentagrophytes  Negative    51. Mite, D Farinae  5,000 AU/ml  Negative    52. Mite, D Pteronyssinus  5,000 AU/ml  Negative    53. Cat Hair 10,000 BAU/ml   Negative    54.  Dog Epithelia  Negative    55. Mixed Feathers  Negative    56. Horse Epithelia  Negative    57. Cockroach, German  Negative    58. Mouse  Negative    59. Tobacco Leaf  Negative     Intradermal - 12/21/19 1451    Time Antigen Placed  1451    Allergen Manufacturer  Lavella Delgado    Location  Back    Number of Test  15    Intradermal  Select    Control  2+    [place]  Negative    Johnson  1+    7 Grass  Negative    Ragweed mix  Negative    Weed mix  1+    Tree mix  Negative    Mold 1  2+    Mold 2  2+    Mold 3  2+    Mold 4  1+    Cat  Negative    Dog  Negative    Cockroach  Negative    Mite mix  Negative       Allergy testing results were read and interpreted by myself, documented by clinical staff.         Salvatore Marvel, MD [facility]

## 2019-12-22 ENCOUNTER — Encounter: Payer: Self-pay | Admitting: Allergy & Immunology

## 2019-12-22 DIAGNOSIS — J3089 Other allergic rhinitis: Secondary | ICD-10-CM | POA: Insufficient documentation

## 2019-12-22 DIAGNOSIS — J302 Other seasonal allergic rhinitis: Secondary | ICD-10-CM | POA: Insufficient documentation

## 2020-01-18 ENCOUNTER — Ambulatory Visit: Payer: 59 | Admitting: Allergy & Immunology

## 2020-01-18 ENCOUNTER — Telehealth: Payer: Self-pay | Admitting: Allergy & Immunology

## 2020-01-18 NOTE — Telephone Encounter (Signed)
Debbie Delgado will this patient qualify for the [place] program..Marland Kitchen

## 2020-01-18 NOTE — Telephone Encounter (Signed)
I called patient about missed appointment. She stated she was having financial difficulty at the moment. She got her prescription for [facility] and was not charged for it. She says it will come again next month, but is $600 and she will not be able to pay. Is there any coupon or anything we can do to reduce the cost?

## 2020-01-18 NOTE — Telephone Encounter (Signed)
I spoke with patient and informed her of the 50.00 or 100.00 with [facility]. I also let her know that I had attempted to do a prior authorization for Xhance on covermymeds. The reply was that the patient was inactive. I let her know this as well and she is going to call them in the morning.

## 2020-01-20 NOTE — Telephone Encounter (Signed)
Left message for patient to call back  

## 2020-01-20 NOTE — Telephone Encounter (Signed)
Patient called back and is still trying to get through to her insurance about the issue. She is going to try again and call back.

## 2020-01-25 NOTE — Telephone Encounter (Signed)
I was finally able to get the prior authorization submitted.

## 2020-02-02 NOTE — Telephone Encounter (Signed)
Prior auth resubmitted due to error.

## 2020-02-14 NOTE — Telephone Encounter (Signed)
Pending at this time.

## 2020-02-21 NOTE — Telephone Encounter (Signed)
Patient is paying the noncovered copay with [facility].

## 2020-09-11 ENCOUNTER — Other Ambulatory Visit: Payer: Self-pay | Admitting: Allergy & Immunology

## 2020-09-27 IMAGING — US US PELVIS COMPLETE WITH TRANSVAGINAL
1 series · 14 of 25 positions shown · non-contrast
Comparison: Pelvic ultrasound 09/24/2010

CLINICAL DATA: Patient with history of right adnexal mass.

EXAM:
TRANSABDOMINAL AND TRANSVAGINAL ULTRASOUND OF PELVIS
TECHNIQUE: Both transabdominal and transvaginal ultrasound examinations of the
pelvis were performed. Transabdominal technique was performed for
global imaging of the pelvis including uterus, ovaries, adnexal
regions, and pelvic cul-de-sac. It was necessary to proceed with
endovaginal exam following the transabdominal exam to visualize the
adnexal structures.

[Series 1: us pelvis complete with transvaginal · 14 of 95 slices shown]
[im 1/95]
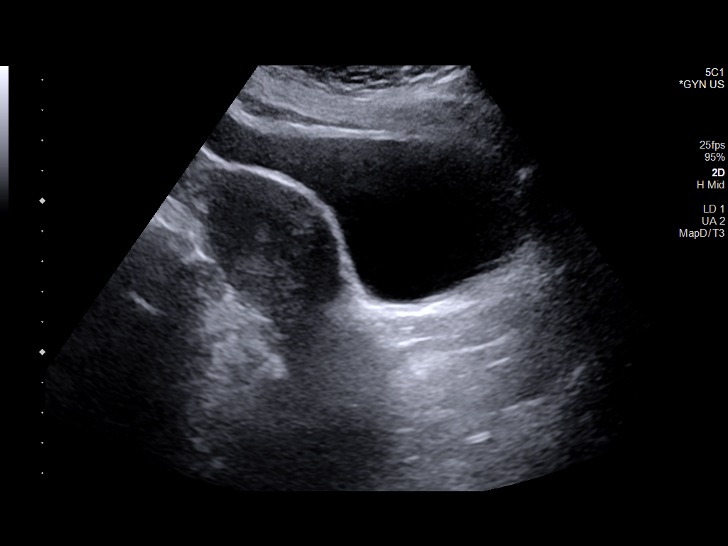
[im 8/95]
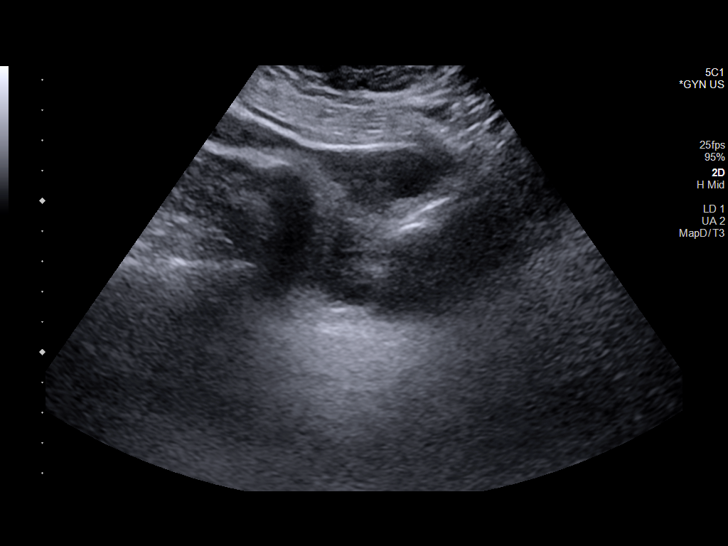
[im 16/95]
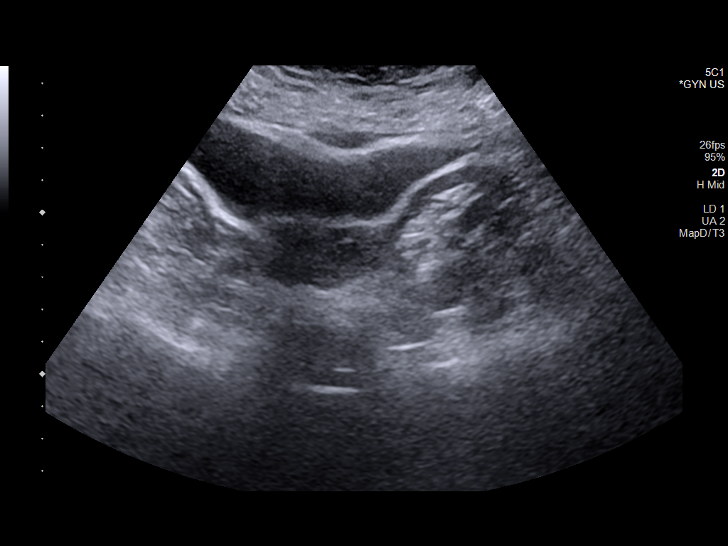
[im 24/95]
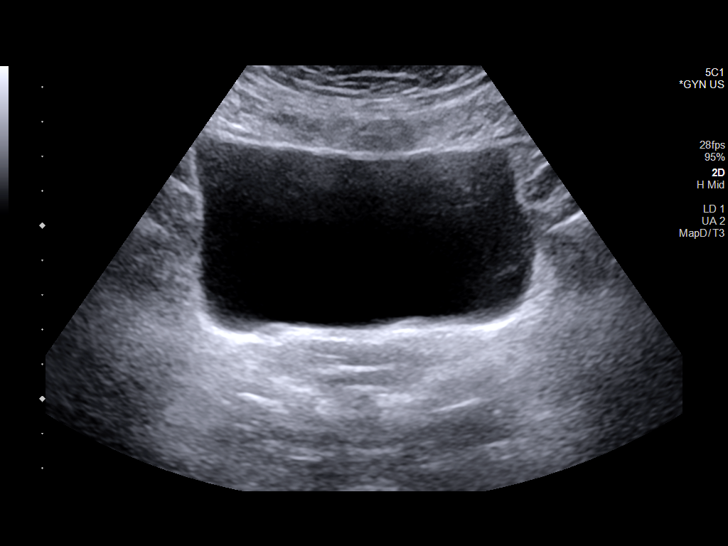
[im 32/95]
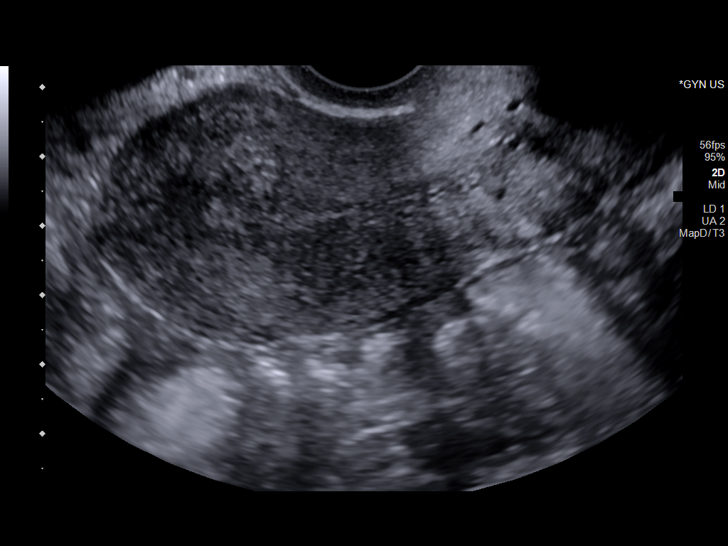
[im 36/95]
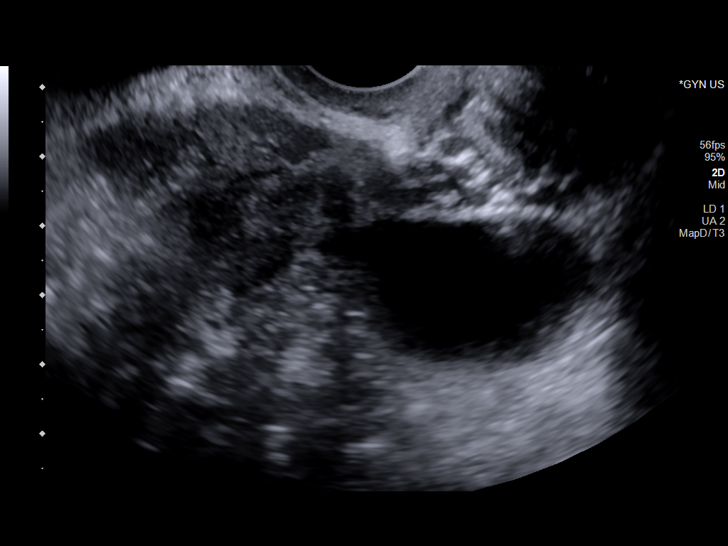
[im 44/95]
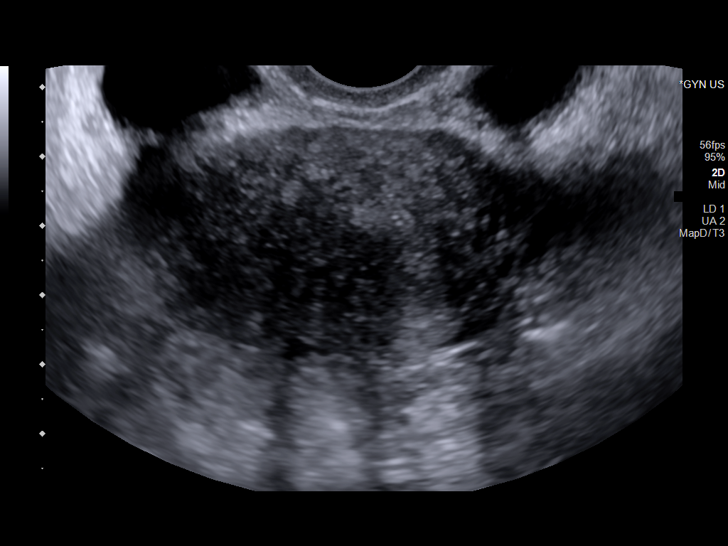
[im 51/95]
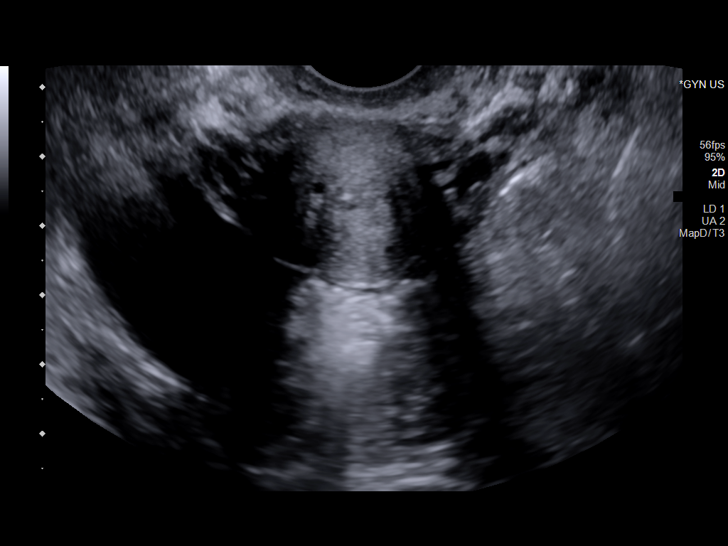
[im 59/95]
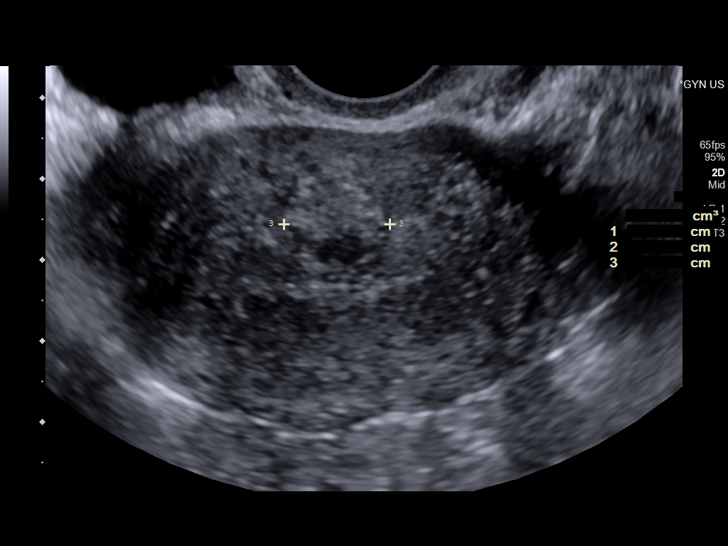
[im 63/95]
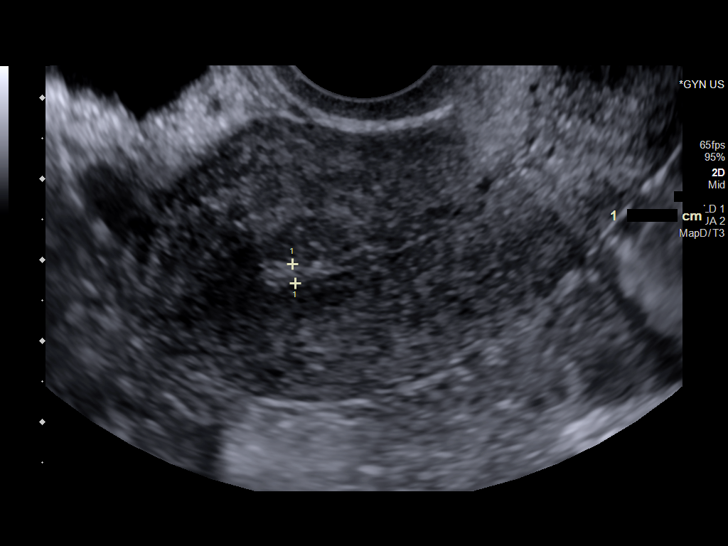
[im 71/95]
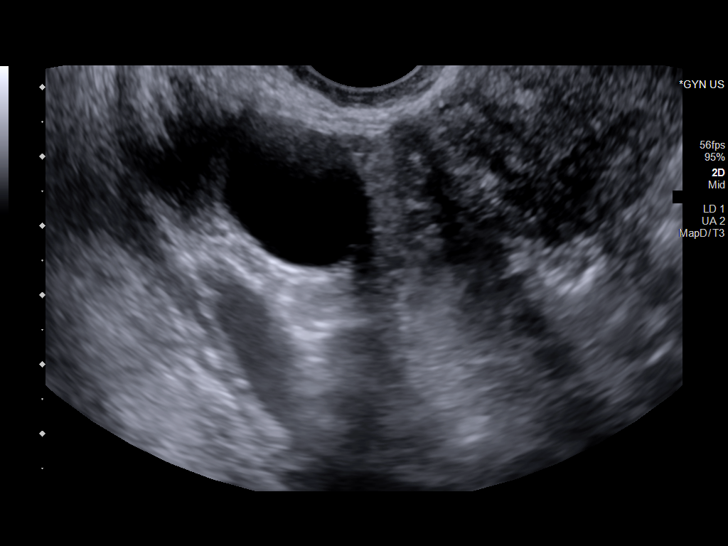
[im 79/95]
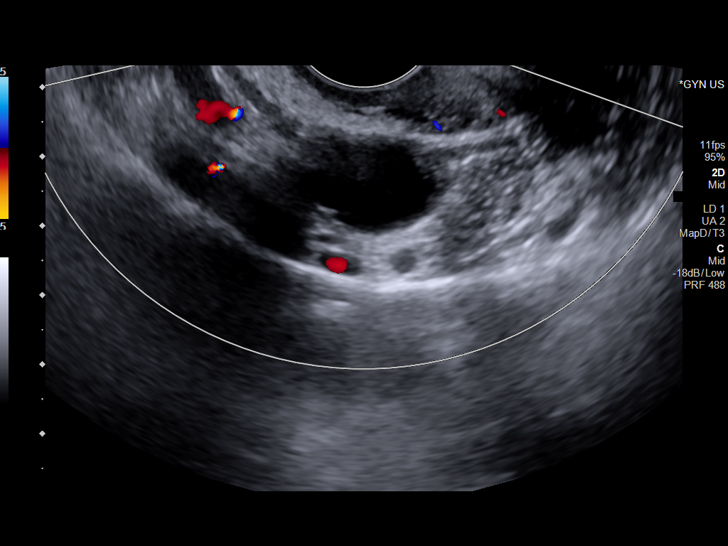
[im 87/95]
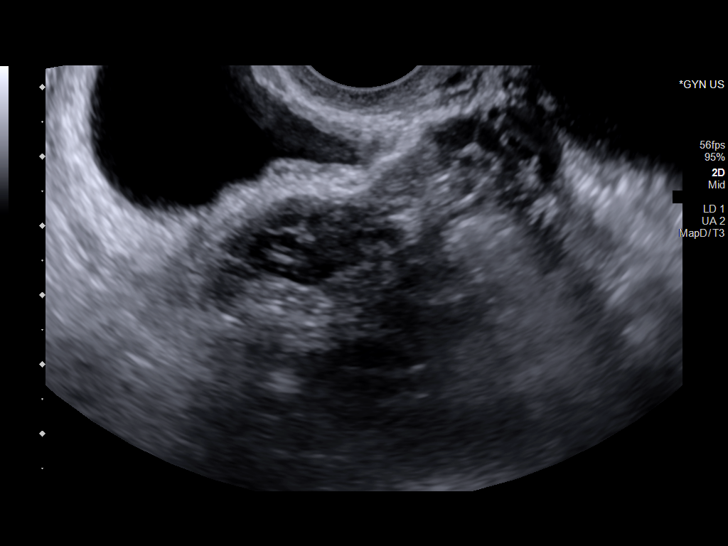
[im 95/95]
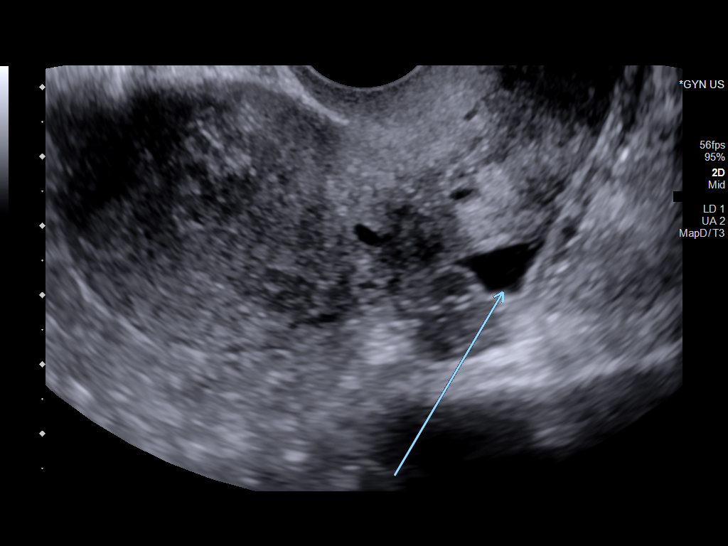

[14 of 25 positions shown; findings below may reference images not displayed]

FINDINGS: Uterus

Measurements: 8.7 x 3.9 x 4.6 cm = volume: 82.2 mL. There is a 1.4 x
1.7 x 1.3 cm intramural echogenic mass favored to represent a
fibroid.

Endometrium

Thickness: 2.4.  No focal abnormality visualized.

Right ovary

Not visualized.

Left ovary

Measurements: 2.5 x 1.4 x 1.7 cm = volume: 3.0 mL. Normal
appearance/no adnexal mass.

Other findings

Within the right adnexa there is a tubular cystic structure which
measures 6.6 x 2.1 x 2.2 cm, favored to represent hydrosalpinx.
IMPRESSION: Probable hydrosalpinx within the right adnexa.

Probable fibroid within the anterior uterine body.

## 2022-04-09 ENCOUNTER — Telehealth: Payer: Self-pay

## 2022-04-09 NOTE — Telephone Encounter (Signed)
error 

## 2022-10-28 ENCOUNTER — Other Ambulatory Visit: Payer: Self-pay | Admitting: Internal Medicine

## 2022-10-28 DIAGNOSIS — Z1231 Encounter for screening mammogram for malignant neoplasm of breast: Secondary | ICD-10-CM

## 2022-11-04 ENCOUNTER — Ambulatory Visit
Admission: RE | Admit: 2022-11-04 | Discharge: 2022-11-04 | Disposition: A | Discharge status: Home / Self Care | Payer: Medicaid Other | Source: Ambulatory Visit | Attending: Internal Medicine | Admitting: Internal Medicine

## 2022-11-04 DIAGNOSIS — Z1231 Encounter for screening mammogram for malignant neoplasm of breast: Secondary | ICD-10-CM

## 2022-11-07 ENCOUNTER — Other Ambulatory Visit: Payer: Self-pay | Admitting: Internal Medicine

## 2022-11-07 DIAGNOSIS — R928 Other abnormal and inconclusive findings on diagnostic imaging of breast: Secondary | ICD-10-CM

## 2022-11-14 ENCOUNTER — Encounter: Payer: Self-pay | Admitting: Allergy & Immunology

## 2022-11-14 ENCOUNTER — Telehealth: Payer: Self-pay

## 2022-11-14 ENCOUNTER — Ambulatory Visit (INDEPENDENT_AMBULATORY_CARE_PROVIDER_SITE_OTHER): Payer: Medicaid Other | Admitting: Allergy & Immunology

## 2022-11-14 ENCOUNTER — Other Ambulatory Visit: Payer: Self-pay

## 2022-11-14 VITALS — BP 134/86 | HR 77 | Temp 98.3°F | Resp 16 | Ht 60.0 in | Wt 153.4 lb

## 2022-11-14 DIAGNOSIS — J302 Other seasonal allergic rhinitis: Secondary | ICD-10-CM

## 2022-11-14 DIAGNOSIS — R053 Chronic cough: Secondary | ICD-10-CM | POA: Diagnosis not present

## 2022-11-14 DIAGNOSIS — J3089 Other allergic rhinitis: Secondary | ICD-10-CM

## 2022-11-14 MED ORDER — CARBINOXAMINE MALEATE 4 MG PO TABS: 4.0000 mg | ORAL_TABLET | Freq: Three times a day (TID) | ORAL | 5 refills | Status: DC | PRN

## 2022-11-14 MED ORDER — PREDNISONE 10 MG PO TABS: ORAL_TABLET | ORAL | 0 refills | Status: DC

## 2022-11-14 MED ORDER — FLUTICASONE PROPIONATE 50 MCG/ACT NA SUSP: 1.0000 | Freq: Two times a day (BID) | NASAL | 5 refills | Status: DC

## 2022-11-14 MED ORDER — MONTELUKAST SODIUM 10 MG PO TABS: 10.0000 mg | ORAL_TABLET | Freq: Every day | ORAL | 5 refills | Status: DC

## 2022-11-14 MED ORDER — AZELASTINE HCL 0.1 % NA SOLN: 2.0000 | Freq: Two times a day (BID) | NASAL | 5 refills | Status: DC

## 2022-11-14 NOTE — Patient Instructions (Addendum)
1. Rhinitis medicamentosa with overlying perennial and seasonal allergic rhinitis - Previous testing showed: grasses, weeds, indoor molds and outdoor molds - Stop the Flonase and start Nasonex two sprays per nostril twice daily. - Start taking Astelin (azelastine) two sprays per nostril twice daily. - Start Singulair (montelukast) 10mg  daily  - Start carbinoxamine 4mg  every 8 hours as needed. - Strongly consider allergy shots for long term control.   2. Return in about 3 months (around 02/13/2023).    Please inform us of any Emergency Department visits, hospitalizations, or changes in symptoms. Call us before going to the ED for breathing or allergy symptoms since we might be able to fit you in for a sick visit. Feel free to contact us anytime with any questions, problems, or concerns.  It was a pleasure to see you today!  Websites that have reliable patient information: 1. American Academy of Asthma, Allergy, and Immunology: www.aaaai.org 2. Food Allergy Research and Education (FARE): foodallergy.org 3. Mothers of Asthmatics: http://www.asthmacommunitynetwork.org 4. American College of Allergy, Asthma, and Immunology: www.acaai.org   COVID-19 Vaccine Information can be found at: [email or web address] For questions related to vaccine distribution or appointments, please email [email or web address] or call [phone].     "Like" [place] on Facebook and Instagram for our latest updates!       Make sure you are registered to vote! If you have moved or changed any of your contact information, you will need to get this updated before voting!  In some cases, you MAY be able to register to vote online: [email or web address]     Allergy Shots  Allergies are the result of a chain reaction that starts in the immune system. Your immune system controls how your body defends itself. For instance, if you have an  allergy to pollen, your immune system identifies pollen as an invader or allergen. Your immune system overreacts by producing antibodies called Immunoglobulin E (IgE). These antibodies travel to cells that release chemicals, causing an allergic reaction.  The concept behind allergy immunotherapy, whether it is received in the form of shots or tablets, is that the immune system can be desensitized to specific allergens that trigger allergy symptoms. Although it requires time and patience, the payback can be long-term relief. Allergy injections contain a dilute solution of those substances that you are allergic to based upon your skin testing and allergy history.   How Do Allergy Shots Work?  Allergy shots work much like a vaccine. Your body responds to injected amounts of a particular allergen given in increasing doses, eventually developing a resistance and tolerance to it. Allergy shots can lead to decreased, minimal or no allergy symptoms.  There generally are two phases: build-up and maintenance. Build-up often ranges from three to six months and involves receiving injections with increasing amounts of the allergens. The shots are typically given once or twice a week, though more rapid build-up schedules are sometimes used.  The maintenance phase begins when the most effective dose is reached. This dose is different for each person, depending on how allergic you are and your response to the build-up injections. Once the maintenance dose is reached, there are longer periods between injections, typically two to four weeks.  Occasionally doctors give cortisone-type shots that can temporarily reduce allergy symptoms. These types of shots are different and should not be confused with allergy immunotherapy shots.  Who Can Be Treated with Allergy Shots?  Allergy shots may be a good treatment approach for people with allergic rhinitis (hay fever), allergic asthma, conjunctivitis (  eye allergy) or stinging  insect allergy.   Before deciding to begin allergy shots, you should consider:   The length of allergy season and the severity of your symptoms  Whether medications and/or changes to your environment can control your symptoms  Your desire to avoid long-term medication use  Time: allergy immunotherapy requires a major time commitment  Cost: may vary depending on your insurance coverage  Allergy shots for children age 30 and older are effective and often well tolerated. They might prevent the onset of new allergen sensitivities or the progression to asthma.  Allergy shots are not started on patients who are pregnant but can be continued on patients who become pregnant while receiving them. In some patients with other medical conditions or who take certain common medications, allergy shots may be of risk. It is important to mention other medications you talk to your allergist.   What are the two types of build-ups offered:   RUSH or Rapid Desensitization -- one day of injections lasting from 8:30-4:30pm, injections every 1 hour.  Approximately half of the build-up process is completed in that one day.  The following week, normal build-up is resumed, and this entails ~16 visits either weekly or twice weekly, until reaching your "maintenance dose" which is continued weekly until eventually getting spaced out to every month for a duration of 3 to 5 years. The regular build-up appointments are nurse visits where the injections are administered, followed by required monitoring for 30 minutes.    Traditional build-up -- weekly visits for 6 -12 months until reaching "maintenance dose", then continue weekly until eventually spacing out to every 4 weeks as above. At these appointments, the injections are administered, followed by required monitoring for 30 minutes.     Either way is acceptable, and both are equally effective. With the rush protocol, the advantage is that less time is spent here for  injections overall AND you would also reach maintenance dosing faster (which is when the clinical benefit starts to become more apparent). Not everyone is a candidate for rapid desensitization.   IF we proceed with the RUSH protocol, there are premedications which must be taken the day before and the day after the rush only (this includes antihistamines, steroids, and Singulair).  After the rush day, no prednisone or Singulair is required, and we just recommend antihistamines taken on your injection day.  What Is An Estimate of the Costs?  If you are interested in starting allergy injections, please check with your insurance company about your coverage for both allergy vial sets and allergy injections.  Please do so prior to making the appointment to start injections.  The following are CPT codes to give to your insurance company. These are the amounts we BILL to the insurance company, but the amount YOU WILL PAY and WE RECEIVE IS SUBSTANTIALLY LESS and depends on the contracts we have with different insurance companies.   Amount Billed to Insurance One allergy vial set  CPT 95165   $ 1200     Two allergy vial set  CPT 95165   $ 2400     Three allergy vial set  CPT 95165   $ 3600     One injection   CPT 95115   $ 35  Two injections   CPT 95117   $ 40 RUSH (Rapid Desensitization) CPT 95180 x 8 hours $500/hour  Regarding the allergy injections, your co-pay may or may not apply with each injection, so please confirm this with  your insurance company. When you start allergy injections, 1 or 2 sets of vials are made based on your allergies.  Not all patients can be on one set of vials. A set of vials lasts 6 months to a year depending on how quickly you can proceed with your build-up of your allergy injections. Vials are personalized for each patient depending on their specific allergens.  How often are allergy injection given during the build-up period?   Injections are given at least weekly during  the build-up period until your maintenance dose is achieved. Per the doctor's discretion, you may have the option of getting allergy injections two times per week during the build-up period. However, there must be at least 48 hours between injections. The build-up period is usually completed within 6-12 months depending on your ability to schedule injections and for adjustments for reactions. When maintenance dose is reached, your injection schedule is gradually changed to every two weeks and later to every three weeks. Injections will then continue every 4 weeks. Usually, injections are continued for a total of 3-5 years.   When Will I Feel Better?  Some may experience decreased allergy symptoms during the build-up phase. For others, it may take as long as 12 months on the maintenance dose. If there is no improvement after a year of maintenance, your allergist will discuss other treatment options with you.  If you aren't responding to allergy shots, it may be because there is not enough dose of the allergen in your vaccine or there are missing allergens that were not identified during your allergy testing. Other reasons could be that there are high levels of the allergen in your environment or major exposure to non-allergic triggers like tobacco smoke.  What Is the Length of Treatment?  Once the maintenance dose is reached, allergy shots are generally continued for three to five years. The decision to stop should be discussed with your allergist at that time. Some people may experience a permanent reduction of allergy symptoms. Others may relapse and a longer course of allergy shots can be considered.  What Are the Possible Reactions?  The two types of adverse reactions that can occur with allergy shots are local and systemic. Common local reactions include very mild redness and swelling at the injection site, which can happen immediately or several hours after. Report a delayed reaction from your  last injection. These include arm swelling or runny nose, watery eyes or cough that occurs within 12-24 hours after injection. A systemic reaction, which is less common, affects the entire body or a particular body system. They are usually mild and typically respond quickly to medications. Signs include increased allergy symptoms such as sneezing, a stuffy nose or hives.   Rarely, a serious systemic reaction called anaphylaxis can develop. Symptoms include swelling in the throat, wheezing, a feeling of tightness in the chest, nausea or dizziness. Most serious systemic reactions develop within 30 minutes of allergy shots. This is why it is strongly recommended you wait in your doctor's office for 30 minutes after your injections. Your allergist is trained to watch for reactions, and his or her staff is trained and equipped with the proper medications to identify and treat them.   Report to the nurse immediately if you experience any of the following symptoms: swelling, itching or redness of the skin, hives, watery eyes/nose, breathing difficulty, excessive sneezing, coughing, stomach pain, diarrhea, or light headedness. These symptoms may occur within 15-20 minutes after injection and may require medication.  Who Should Administer Allergy Shots?  The preferred location for receiving shots is your prescribing allergist's office. Injections can sometimes be given at another facility where the physician and staff are trained to recognize and treat reactions, and have received instructions by your prescribing allergist.  What if I am late for an injection?   Injection dose will be adjusted depending upon how many days or weeks you are late for your injection.   What if I am sick?   Please report any illness to the nurse before receiving injections. She may adjust your dose or postpone injections depending on your symptoms. If you have fever, flu, sinus infection or chest congestion it is best to postpone  allergy injections until you are better. Never get an allergy injection if your asthma is causing you problems. If your symptoms persist, seek out medical care to get your health problem under control.  What If I am or Become Pregnant:  Women that become pregnant should schedule an appointment with [facility] before receiving any further allergy injections.
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
## 2022-11-14 NOTE — Telephone Encounter (Signed)
Prior Auth needed for carbinoxamine  every 8 hours as needed.

## 2022-11-17 ENCOUNTER — Telehealth: Payer: Self-pay

## 2022-11-17 DIAGNOSIS — J301 Allergic rhinitis due to pollen: Secondary | ICD-10-CM | POA: Diagnosis not present

## 2022-11-17 MED ORDER — CYPROHEPTADINE HCL 4 MG PO TABS: 4.0000 mg | ORAL_TABLET | Freq: Three times a day (TID) | ORAL | 5 refills | Status: DC | PRN

## 2022-11-17 NOTE — Telephone Encounter (Signed)
Called patient - DOB verified - advised of provider notation below.  Patient verbalized understanding, no questions.

## 2022-11-17 NOTE — Progress Notes (Signed)
ADDITIONAL LABEL NEEDED 

## 2022-11-17 NOTE — Progress Notes (Signed)
VIALS EXP 11-17-23 

## 2022-11-17 NOTE — Telephone Encounter (Signed)
I guess let's try using Periactin  every 8 hours as needed.   I sent in the script. Can someone call the patient to let her know? It can cause sleepiness, so use with caution.   Malachi Bonds, MD [facility]

## 2022-11-17 NOTE — Telephone Encounter (Signed)
PA submitted, will be updated in additional encounter created.

## 2022-11-17 NOTE — Progress Notes (Signed)
Aeroallergen Immunotherapy   Ordering Provider: Dr. Malachi Bonds   Patient Details  Name: Debbie Delgado  MRN: [id number]  Date of Birth: 1970/07/31   Order 2 of 2   Vial Label: Molds   0.2 ml (Volume)  1:20 Concentration -- Alternaria alternata  0.2 ml (Volume)  1:20 Concentration -- Cladosporium herbarum  0.2 ml (Volume)  1:10 Concentration -- Aspergillus mix  0.2 ml (Volume)  1:10 Concentration -- Penicillium mix  0.2 ml (Volume)  1:20 Concentration -- Bipolaris sorokiniana  0.2 ml (Volume)  1:20 Concentration -- Drechslera spicifera  0.2 ml (Volume)  1:10 Concentration -- Mucor plumbeus  0.2 ml (Volume)  1:10 Concentration -- Fusarium moniliforme  0.2 ml (Volume)  1:40 Concentration -- Aureobasidium pullulans  0.2 ml (Volume)  1:10 Concentration -- Rhizopus oryzae    2.0  ml Extract Subtotal  3.0  ml Diluent  5.0  ml Maintenance Total   Schedule:  B   Blue Vial (1:100,000): Schedule B (6 doses)  Yellow Vial (1:10,000): Schedule B (6 doses)  Green Vial (1:1,000): Schedule B (6 doses)  Red Vial (1:100): Schedule A (14 doses)   Special Instructions: none

## 2022-11-17 NOTE — Telephone Encounter (Signed)
PA request received via provider/CMM for Carbinoxamine Maleate  tablets  PA has been submitted via CMM to [facility] Medicaid  and is pending determination  Key: [id number]

## 2022-11-17 NOTE — Progress Notes (Signed)
Aeroallergen Immunotherapy   Ordering Provider: Dr. Malachi Bonds   Patient Details  Name: Debbie Delgado  MRN: [id number]  Date of Birth: March 25, 1971   Order 1 of 2   Vial Label: G/W   0.5 ml (Volume)  BAU Concentration -- 7 Grass Mix* 100,000 ([address] [place], [place], [place], Perennial [place], [place], [place], [place])  0.7 ml (Volume)  1:20 Concentration -- Weed Mix*    1.2  ml Extract Subtotal  3.8  ml Diluent  5.0  ml Maintenance Total   Schedule:  B   Blue Vial (1:100,000): Schedule B (6 doses)  Yellow Vial (1:10,000): Schedule B (6 doses)  Green Vial (1:1,000): Schedule B (6 doses)  Red Vial (1:100): Schedule A (14 doses)   Special Instructions: none

## 2022-11-17 NOTE — Telephone Encounter (Signed)
PA has been DENIED due to:   Denied. Per the health plan's preferred drug list, at least 2 preferred drugs must be tried before requesting this drug or tell us why the member cannot try any preferred alternatives. Please send [place] supporting chart notes and lab results. Here is list of preferred alternatives: carbinoxamine solution, cyproheptadine syrup / tablet, hydroxyzine capsule / solution / tablet. Note: Some preferred drug(s) may have quantity limits. Refer to the health plan's preferred drug list for additional details.

## 2022-11-18 DIAGNOSIS — J302 Other seasonal allergic rhinitis: Secondary | ICD-10-CM | POA: Diagnosis not present

## 2022-11-19 ENCOUNTER — Other Ambulatory Visit (HOSPITAL_COMMUNITY): Payer: Self-pay

## 2022-11-24 ENCOUNTER — Other Ambulatory Visit: Payer: 59

## 2022-12-03 ENCOUNTER — Ambulatory Visit: Payer: Medicaid Other

## 2022-12-03 ENCOUNTER — Other Ambulatory Visit: Payer: Self-pay | Admitting: Student

## 2022-12-03 DIAGNOSIS — R921 Mammographic calcification found on diagnostic imaging of breast: Secondary | ICD-10-CM

## 2022-12-05 ENCOUNTER — Ambulatory Visit: Payer: Medicaid Other

## 2022-12-05 DIAGNOSIS — J309 Allergic rhinitis, unspecified: Secondary | ICD-10-CM

## 2022-12-05 MED ORDER — EPINEPHRINE 0.3 MG/0.3ML IJ SOAJ: 0.3000 mg | INTRAMUSCULAR | 1 refills | Status: DC | PRN

## 2022-12-05 NOTE — Progress Notes (Signed)
Immunotherapy   Patient Details  Name: Debbie Delgado MRN: [id number] Date of Birth: 1970-08-17  12/05/2022  [name] started injections for  molds, grasses, and weeds.  Following schedule: B  Frequency:1 time per week Epi-Pen:Prescription for Epi-Pen given Consent signed previously and patient instructions given. Patient sat in the lobby for thirty minutes without an issue.    Ralene Muskrat 12/05/2022, 3:05 PM

## 2022-12-08 ENCOUNTER — Ambulatory Visit
Admission: RE | Admit: 2022-12-08 | Discharge: 2022-12-08 | Disposition: A | Discharge status: Home / Self Care | Payer: Medicaid Other | Source: Ambulatory Visit | Attending: Student | Admitting: Student

## 2022-12-08 DIAGNOSIS — R921 Mammographic calcification found on diagnostic imaging of breast: Secondary | ICD-10-CM

## 2022-12-08 HISTORY — PX: BREAST BIOPSY: SHX20

## 2022-12-29 ENCOUNTER — Ambulatory Visit: Payer: Self-pay | Admitting: Surgery

## 2023-01-07 ENCOUNTER — Ambulatory Visit: Payer: Self-pay | Admitting: Surgery

## 2023-01-07 DIAGNOSIS — N6081 Other benign mammary dysplasias of right breast: Secondary | ICD-10-CM

## 2023-01-08 ENCOUNTER — Other Ambulatory Visit: Payer: Self-pay | Admitting: Surgery

## 2023-01-08 DIAGNOSIS — N6081 Other benign mammary dysplasias of right breast: Secondary | ICD-10-CM

## 2023-02-02 ENCOUNTER — Encounter: Payer: Self-pay | Admitting: Allergy & Immunology

## 2023-02-02 ENCOUNTER — Ambulatory Visit (INDEPENDENT_AMBULATORY_CARE_PROVIDER_SITE_OTHER): Payer: Medicaid Other | Admitting: Allergy & Immunology

## 2023-02-02 DIAGNOSIS — R053 Chronic cough: Secondary | ICD-10-CM | POA: Diagnosis not present

## 2023-02-02 DIAGNOSIS — J3089 Other allergic rhinitis: Secondary | ICD-10-CM

## 2023-02-02 DIAGNOSIS — J302 Other seasonal allergic rhinitis: Secondary | ICD-10-CM | POA: Diagnosis not present

## 2023-02-02 MED ORDER — AZELASTINE HCL 0.1 % NA SOLN: 2.0000 | Freq: Two times a day (BID) | NASAL | 5 refills | Status: DC

## 2023-02-02 NOTE — Progress Notes (Signed)
RE: Debbie Delgado MRN: [id number] DOB: 03-Apr-1971 Date of Telemedicine Visit: 02/02/2023  Referring provider: Stevphen Rochester, MD Primary care provider: Stevphen Rochester, MD  Chief Complaint: No chief complaint on file.   Telemedicine Follow Up Visit via Telephone: I connected with Yisroel Ramming for a follow up on 02/02/23 by telephone and verified that I am speaking with the correct person using two identifiers.   I discussed the limitations, risks, security and privacy concerns of performing an evaluation and management service by telephone and the availability of in person appointments. I also discussed with the patient that there may be a patient responsible charge related to this service. The patient expressed understanding and agreed to proceed.  Patient is at home.  Provider is at the office.  Visit start time: 12:00 PM Visit end time: 12:22 PM Insurance consent/check in by: [facility] consent and medical assistant/nurse: Cree  History of Present Illness:  Debbie Delgado is a 52 y.o. female presenting for a follow up visit. She was last seen in April 2024. At that time, she was not well controlled with regards to her allergic rhinitis. We stopped her Flonase and started Nasonex two sprays per nostril daily. We also started Singulair as well as carbinoxamine. We did discuss allergy shots for long term control of symptoms. She called back and made the decision to start allergy shots. She showed up for one injection in May 2024 and never came back after that. Coughing had improved.   Since the last visit, she has largely done well.   Allergic Rhinitis Symptom History: Her allergies are not under good control. She is requesting more azelastine. She is using this 3 times per day sometimes. She had a lot of constipation from her antihistamines. This was the Periactin. She is not taking any antihistamines at all. She is coming in Wednesday for her next injection. She is going to talk to  her job about getting off early on Wednesdays. She is going to have to get FMLA to get off early on Wednesdays. She works from home and the [place] office - which has later hours - is much farther from her home compared to the [place] office. She is wanting to continue with the allergy shots for long term control. She has not had any sinus infections or other infections since the last visit.   Coughing is mostly resolved. She has not needed any prednisone and has not been to the ED for her symptoms.   She is going to be having breast surgery on July 23rd. They found a mass in her breast but this is non-cancerous. They just want to remove it because it is leading to the development of calcifications in her breast tissue.   Otherwise, there have been no changes to her past medical history, surgical history, family history, or social history.  Assessment and Plan:  [name] is a 52 y.o. female with:  Cough - likely from postnasal drip (resolved with adequate control of postnasal drip)   Rhinitis medicamentosa    Perennial and seasonal allergic rhinitis (grasses, weeds, indoor molds and outdoor molds)   We are going to continue with the allergy shots at the same schedule. We did discuss that the shots will change to twice monthly and then once monthly once she reaches maintenance. So this should help with the work flexibility as well. She is going to bring in FMLA forms so we can help her with flexibility.   Diagnostics: None.  Medication List:  Current Outpatient Medications  Medication Sig Dispense Refill   azelastine (ASTELIN) 0.1 % nasal spray Place 2 sprays into both nostrils 2 (two) times daily. Use in each nostril as directed 60 mL 5   Azelastine HCl 0.15 % SOLN Place 1 spray into both nostrils 2 (two) times daily. (Patient not taking: Reported on 11/14/2022) 30 mL 5   cycloSPORINE (RESTASIS) 0.05 % ophthalmic emulsion Place 1 drop into both eyes every 12 (twelve) hours.      cycloSPORINE (RESTASIS) 0.05 % ophthalmic emulsion Administer 1 drop into both eyes every 12 (twelve) hours.     EPINEPHrine (EPIPEN 2-PAK) 0.3 mg/0.3 mL IJ SOAJ injection Inject 0.3 mg into the muscle as needed for anaphylaxis. 2 each 1   EYSUVIS 0.25 % SUSP INSTILL 1 INTO EACH EYE 4 TIMES DAILY FOR 7 DAYS, THEN 1 INTO EACH EYE THREE TIMES DAILY FOR 7 DAYS, THEN 1 INTO EACH EYE TWICE DAILY FOR 7 DAYS, THEN 1 ONCE DAILY INTO EACH EYE FOR 7 DAYS.     No current facility-administered medications for this visit.   Allergies: Allergies  Allergen Reactions   Codeine Itching and Nausea And Vomiting    "can take with promethazine"   I reviewed her past medical history, social history, family history, and environmental history and no significant changes have been reported from previous visits.  Review of Systems  Constitutional:  Negative for activity change and appetite change.  HENT:  Positive for congestion and sinus pressure. Negative for postnasal drip, rhinorrhea and sore throat.   Eyes:  Negative for pain, discharge, redness and itching.  Respiratory:  Negative for shortness of breath, wheezing and stridor.   Gastrointestinal:  Negative for diarrhea, nausea and vomiting.  Musculoskeletal:  Negative for arthralgias, joint swelling and myalgias.  Skin:  Negative for rash.  Allergic/Immunologic: Negative for environmental allergies and food allergies.    Objective:  Physical exam not obtained as encounter was done via telephone.   Previous notes and tests were reviewed.  I discussed the assessment and treatment plan with the patient. The patient was provided an opportunity to ask questions and all were answered. The patient agreed with the plan and demonstrated an understanding of the instructions.   The patient was advised to call back or seek an in-person evaluation if the symptoms worsen or if the condition fails to improve as anticipated.  I provided 22 minutes of non-face-to-face  time during this encounter.  It was my pleasure to participate in [place]'s care today. Please feel free to contact me with any questions or concerns.   Sincerely,  Alfonse Spruce, MD

## 2023-02-04 ENCOUNTER — Ambulatory Visit (INDEPENDENT_AMBULATORY_CARE_PROVIDER_SITE_OTHER): Payer: Medicaid Other

## 2023-02-04 DIAGNOSIS — J309 Allergic rhinitis, unspecified: Secondary | ICD-10-CM | POA: Diagnosis not present

## 2023-02-05 ENCOUNTER — Other Ambulatory Visit: Payer: Self-pay

## 2023-02-05 ENCOUNTER — Encounter (HOSPITAL_BASED_OUTPATIENT_CLINIC_OR_DEPARTMENT_OTHER): Payer: Self-pay | Admitting: Surgery

## 2023-02-11 ENCOUNTER — Ambulatory Visit (INDEPENDENT_AMBULATORY_CARE_PROVIDER_SITE_OTHER): Payer: Medicaid Other

## 2023-02-11 ENCOUNTER — Telehealth: Payer: Self-pay | Admitting: Allergy & Immunology

## 2023-02-11 DIAGNOSIS — J309 Allergic rhinitis, unspecified: Secondary | ICD-10-CM

## 2023-02-11 NOTE — Telephone Encounter (Addendum)
The patient came into the office to drop off FMLA forms. Patient was informed to give [place] 3-5 business days before picking up. Patient has paid 25 dollar fee. The form has been put in the red accordian folder under the H section.

## 2023-02-13 ENCOUNTER — Ambulatory Visit: Payer: Medicaid Other | Admitting: Allergy & Immunology

## 2023-02-16 ENCOUNTER — Ambulatory Visit
Admission: RE | Admit: 2023-02-16 | Discharge: 2023-02-16 | Disposition: A | Discharge status: Home / Self Care | Payer: Medicaid Other | Source: Ambulatory Visit | Attending: Surgery | Admitting: Surgery

## 2023-02-16 HISTORY — PX: BREAST BIOPSY: SHX20

## 2023-02-16 NOTE — Anesthesia Preprocedure Evaluation (Signed)
Anesthesia Evaluation  Patient identified by MRN, date of birth, ID band Patient awake    Reviewed: Allergy & Precautions, NPO status , Patient's Chart, lab work & pertinent test results  History of Anesthesia Complications Negative for: history of anesthetic complications  Airway Mallampati: II  TM Distance: >3 FB Neck ROM: Full    Dental  (+) Missing,    Pulmonary asthma , Current Smoker and Patient abstained from smoking.   Pulmonary exam normal        Cardiovascular negative cardio ROS Normal cardiovascular exam     Neuro/Psych    GI/Hepatic negative GI ROS, Neg liver ROS,,,  Endo/Other  negative endocrine ROS    Renal/GU negative Renal ROS     Musculoskeletal negative musculoskeletal ROS (+)    Abdominal   Peds  Hematology  (+) Blood dyscrasia, anemia   Anesthesia Other Findings Day of surgery medications reviewed with patient.  Reproductive/Obstetrics                              Anesthesia Physical Anesthesia Plan  ASA: 2  Anesthesia Plan: General   Post-op Pain Management: Tylenol PO (pre-op)*   Induction: Intravenous  PONV Risk Score and Plan: 2 and Ondansetron, Dexamethasone, Treatment may vary due to age or medical condition and Midazolam  Airway Management Planned: LMA  Additional Equipment: None  Intra-op Plan:   Post-operative Plan: Extubation in OR  Informed Consent: I have reviewed the patients History and Physical, chart, labs and discussed the procedure including the risks, benefits and alternatives for the proposed anesthesia with the patient or authorized representative who has indicated his/her understanding and acceptance.     Dental advisory given  Plan Discussed with: CRNA  Anesthesia Plan Comments:         Anesthesia Quick Evaluation

## 2023-02-17 ENCOUNTER — Ambulatory Visit (HOSPITAL_BASED_OUTPATIENT_CLINIC_OR_DEPARTMENT_OTHER): Payer: Medicaid Other | Admitting: Anesthesiology

## 2023-02-17 ENCOUNTER — Ambulatory Visit (HOSPITAL_BASED_OUTPATIENT_CLINIC_OR_DEPARTMENT_OTHER): Admission: RE | Admit: 2023-02-17 | Payer: Medicaid Other | Source: Home / Self Care | Admitting: Surgery

## 2023-02-17 ENCOUNTER — Encounter (HOSPITAL_BASED_OUTPATIENT_CLINIC_OR_DEPARTMENT_OTHER)
Admission: RE | Disposition: A | Discharge status: Home / Self Care | Payer: Self-pay | Source: Home / Self Care | Attending: Surgery

## 2023-02-17 ENCOUNTER — Ambulatory Visit
Admission: RE | Admit: 2023-02-17 | Discharge: 2023-02-17 | Disposition: A | Discharge status: Home / Self Care | Payer: Medicaid Other | Source: Ambulatory Visit | Attending: Surgery | Admitting: Surgery

## 2023-02-17 ENCOUNTER — Encounter (HOSPITAL_BASED_OUTPATIENT_CLINIC_OR_DEPARTMENT_OTHER): Payer: Self-pay | Admitting: Surgery

## 2023-02-17 DIAGNOSIS — N6081 Other benign mammary dysplasias of right breast: Secondary | ICD-10-CM

## 2023-02-17 DIAGNOSIS — N6011 Diffuse cystic mastopathy of right breast: Secondary | ICD-10-CM | POA: Diagnosis not present

## 2023-02-17 DIAGNOSIS — F17210 Nicotine dependence, cigarettes, uncomplicated: Secondary | ICD-10-CM | POA: Insufficient documentation

## 2023-02-17 DIAGNOSIS — Z01818 Encounter for other preprocedural examination: Secondary | ICD-10-CM

## 2023-02-17 HISTORY — DX: Other complications of anesthesia, initial encounter: T88.59XA

## 2023-02-17 HISTORY — PX: BREAST LUMPECTOMY WITH RADIOACTIVE SEED LOCALIZATION: SHX6424

## 2023-02-17 LAB — POCT PREGNANCY, URINE: Preg Test, Ur: NEGATIVE

## 2023-02-17 SURGERY — BREAST LUMPECTOMY WITH RADIOACTIVE SEED LOCALIZATION
Anesthesia: General | Site: Breast | Laterality: Right

## 2023-02-17 MED ORDER — CEFAZOLIN SODIUM-DEXTROSE 2-3 GM-%(50ML) IV SOLR
INTRAVENOUS | Status: DC | PRN
  Administered 2023-02-17: 2 g via INTRAVENOUS

## 2023-02-17 MED ORDER — LACTATED RINGERS IV SOLN: INTRAVENOUS | Status: DC

## 2023-02-17 MED ORDER — MIDAZOLAM HCL 2 MG/2ML IJ SOLN
INTRAMUSCULAR | Status: AC
  Filled 2023-02-17: qty 2

## 2023-02-17 MED ORDER — EPHEDRINE SULFATE (PRESSORS) 50 MG/ML IJ SOLN
INTRAMUSCULAR | Status: DC | PRN
  Administered 2023-02-17 (×2): 10 mg via INTRAVENOUS

## 2023-02-17 MED ORDER — DEXAMETHASONE SODIUM PHOSPHATE 10 MG/ML IJ SOLN
INTRAMUSCULAR | Status: AC
  Filled 2023-02-17: qty 1

## 2023-02-17 MED ORDER — ACETAMINOPHEN 500 MG PO TABS: 1000.0000 mg | ORAL_TABLET | Freq: Once | ORAL | Status: DC

## 2023-02-17 MED ORDER — FENTANYL CITRATE (PF) 100 MCG/2ML IJ SOLN
INTRAMUSCULAR | Status: AC
  Filled 2023-02-17: qty 2

## 2023-02-17 MED ORDER — MIDAZOLAM HCL 2 MG/2ML IJ SOLN
INTRAMUSCULAR | Status: DC | PRN
  Administered 2023-02-17: 2 mg via INTRAVENOUS

## 2023-02-17 MED ORDER — SODIUM CHLORIDE 0.9 % IV SOLN
INTRAVENOUS | Status: AC
  Filled 2023-02-17: qty 10

## 2023-02-17 MED ORDER — GLYCOPYRROLATE PF 0.2 MG/ML IJ SOSY
PREFILLED_SYRINGE | INTRAMUSCULAR | Status: AC
  Filled 2023-02-17: qty 1

## 2023-02-17 MED ORDER — PROPOFOL 10 MG/ML IV BOLUS
INTRAVENOUS | Status: AC
  Filled 2023-02-17: qty 20

## 2023-02-17 MED ORDER — FENTANYL CITRATE (PF) 100 MCG/2ML IJ SOLN: 25.0000 ug | INTRAMUSCULAR | Status: DC | PRN

## 2023-02-17 MED ORDER — LIDOCAINE 2% (20 MG/ML) 5 ML SYRINGE
INTRAMUSCULAR | Status: AC
  Filled 2023-02-17: qty 5

## 2023-02-17 MED ORDER — BUPIVACAINE-EPINEPHRINE (PF) 0.25% -1:200000 IJ SOLN
INTRAMUSCULAR | Status: AC
  Filled 2023-02-17: qty 30

## 2023-02-17 MED ORDER — OXYCODONE HCL 5 MG PO TABS
ORAL_TABLET | ORAL | Status: AC
  Filled 2023-02-17: qty 1

## 2023-02-17 MED ORDER — CEFAZOLIN SODIUM-DEXTROSE 2-4 GM/100ML-% IV SOLN: 2.0000 g | INTRAVENOUS | Status: DC

## 2023-02-17 MED ORDER — OXYCODONE HCL 5 MG PO TABS
5.0000 mg | ORAL_TABLET | Freq: Once | ORAL | Status: AC | PRN
  Administered 2023-02-17: 5 mg via ORAL

## 2023-02-17 MED ORDER — LIDOCAINE HCL (CARDIAC) PF 100 MG/5ML IV SOSY
PREFILLED_SYRINGE | INTRAVENOUS | Status: DC | PRN
  Administered 2023-02-17: 100 mg via INTRAVENOUS

## 2023-02-17 MED ORDER — LACTATED RINGERS IV SOLN: INTRAVENOUS | Status: DC | PRN

## 2023-02-17 MED ORDER — AMISULPRIDE (ANTIEMETIC) 5 MG/2ML IV SOLN: 10.0000 mg | Freq: Once | INTRAVENOUS | Status: DC | PRN

## 2023-02-17 MED ORDER — CHLORHEXIDINE GLUCONATE CLOTH 2 % EX PADS: 6.0000 | MEDICATED_PAD | Freq: Once | CUTANEOUS | Status: DC

## 2023-02-17 MED ORDER — SODIUM CHLORIDE 0.9 % IV SOLN
INTRAVENOUS | Status: DC | PRN
  Administered 2023-02-17: 500 mL

## 2023-02-17 MED ORDER — OXYCODONE HCL 5 MG/5ML PO SOLN: 5.0000 mg | Freq: Once | ORAL | Status: AC | PRN

## 2023-02-17 MED ORDER — ONDANSETRON HCL 4 MG/2ML IJ SOLN
INTRAMUSCULAR | Status: AC
  Filled 2023-02-17: qty 2

## 2023-02-17 MED ORDER — DEXAMETHASONE SODIUM PHOSPHATE 10 MG/ML IJ SOLN
INTRAMUSCULAR | Status: DC | PRN
  Administered 2023-02-17: 5 mg via INTRAVENOUS

## 2023-02-17 MED ORDER — PROPOFOL 500 MG/50ML IV EMUL
INTRAVENOUS | Status: DC | PRN
  Administered 2023-02-17: 35 ug/kg/min via INTRAVENOUS

## 2023-02-17 MED ORDER — FENTANYL CITRATE (PF) 100 MCG/2ML IJ SOLN
INTRAMUSCULAR | Status: DC | PRN
  Administered 2023-02-17: 50 ug via INTRAVENOUS

## 2023-02-17 MED ORDER — OXYCODONE HCL 5 MG PO TABS: 5.0000 mg | ORAL_TABLET | Freq: Four times a day (QID) | ORAL | 0 refills | Status: DC | PRN

## 2023-02-17 MED ORDER — PROPOFOL 10 MG/ML IV BOLUS
INTRAVENOUS | Status: DC | PRN
  Administered 2023-02-17: 150 mg via INTRAVENOUS

## 2023-02-17 MED ORDER — BUPIVACAINE-EPINEPHRINE (PF) 0.25% -1:200000 IJ SOLN
INTRAMUSCULAR | Status: DC | PRN
  Administered 2023-02-17: 20 mL

## 2023-02-17 SURGICAL SUPPLY — 50 items
ADH SKN CLS APL DERMABOND .7 (GAUZE/BANDAGES/DRESSINGS) ×1
APL PRP STRL LF DISP 70% ISPRP (MISCELLANEOUS) ×1
APPLIER CLIP 9.375 MED OPEN (MISCELLANEOUS)
APR CLP MED 9.3 20 MLT OPN (MISCELLANEOUS)
BAG DECANTER FOR FLEXI CONT (MISCELLANEOUS) IMPLANT
BINDER BREAST LRG (GAUZE/BANDAGES/DRESSINGS) IMPLANT
BINDER BREAST MEDIUM (GAUZE/BANDAGES/DRESSINGS) IMPLANT
BINDER BREAST XLRG (GAUZE/BANDAGES/DRESSINGS) IMPLANT
BINDER BREAST XXLRG (GAUZE/BANDAGES/DRESSINGS) IMPLANT
BLADE SURG 15 STRL LF DISP TIS (BLADE) ×1 IMPLANT
BLADE SURG 15 STRL SS (BLADE) ×1
CANISTER SUC SOCK COL 7IN (MISCELLANEOUS) IMPLANT
CANISTER SUCT 1200ML W/VALVE (MISCELLANEOUS) IMPLANT
CHLORAPREP W/TINT 26 (MISCELLANEOUS) ×1 IMPLANT
CLIP APPLIE 9.375 MED OPEN (MISCELLANEOUS) IMPLANT
COVER BACK TABLE 60X90IN (DRAPES) ×1 IMPLANT
COVER MAYO STAND STRL (DRAPES) ×1 IMPLANT
COVER PROBE CYLINDRICAL 5X96 (MISCELLANEOUS) ×1 IMPLANT
DERMABOND ADVANCED .7 DNX12 (GAUZE/BANDAGES/DRESSINGS) ×1 IMPLANT
DRAPE LAPAROSCOPIC ABDOMINAL (DRAPES) IMPLANT
DRAPE LAPAROTOMY 100X72 PEDS (DRAPES) ×1 IMPLANT
DRAPE UTILITY XL STRL (DRAPES) ×1 IMPLANT
ELECT COATED BLADE 2.86 ST (ELECTRODE) ×1 IMPLANT
ELECT REM PT RETURN 9FT ADLT (ELECTROSURGICAL) ×1
ELECTRODE REM PT RTRN 9FT ADLT (ELECTROSURGICAL) ×1 IMPLANT
GLOVE BIOGEL PI IND STRL 8 (GLOVE) ×1 IMPLANT
GLOVE ECLIPSE 8.0 STRL XLNG CF (GLOVE) ×1 IMPLANT
GOWN STRL REUS W/ TWL LRG LVL3 (GOWN DISPOSABLE) ×2 IMPLANT
GOWN STRL REUS W/ TWL XL LVL3 (GOWN DISPOSABLE) ×1 IMPLANT
GOWN STRL REUS W/TWL LRG LVL3 (GOWN DISPOSABLE) ×2
GOWN STRL REUS W/TWL XL LVL3 (GOWN DISPOSABLE) ×1
HEMOSTAT ARISTA ABSORB 3G PWDR (HEMOSTASIS) IMPLANT
HEMOSTAT SNOW SURGICEL 2X4 (HEMOSTASIS) IMPLANT
KIT MARKER MARGIN INK (KITS) ×1 IMPLANT
NDL HYPO 25X1 1.5 SAFETY (NEEDLE) ×1 IMPLANT
NEEDLE HYPO 25X1 1.5 SAFETY (NEEDLE) ×1 IMPLANT
NS IRRIG 1000ML POUR BTL (IV SOLUTION) ×1 IMPLANT
PACK BASIN DAY SURGERY FS (CUSTOM PROCEDURE TRAY) ×1 IMPLANT
PENCIL SMOKE EVACUATOR (MISCELLANEOUS) ×1 IMPLANT
SLEEVE SCD COMPRESS KNEE MED (STOCKING) ×1 IMPLANT
SPIKE FLUID TRANSFER (MISCELLANEOUS) IMPLANT
SPONGE T-LAP 4X18 ~~LOC~~+RFID (SPONGE) ×1 IMPLANT
SUT MNCRL AB 4-0 PS2 18 (SUTURE) ×1 IMPLANT
SUT SILK 2 0 SH (SUTURE) IMPLANT
SUT VICRYL 3-0 CR8 SH (SUTURE) ×1 IMPLANT
SYR CONTROL 10ML LL (SYRINGE) ×1 IMPLANT
TOWEL GREEN STERILE FF (TOWEL DISPOSABLE) ×1 IMPLANT
TRAY FAXITRON CT DISP (TRAY / TRAY PROCEDURE) ×1 IMPLANT
TUBE CONNECTING 20X1/4 (TUBING) IMPLANT
YANKAUER SUCT BULB TIP NO VENT (SUCTIONS) IMPLANT

## 2023-02-17 NOTE — Op Note (Signed)
Preoperative diagnosis: Right breast flat atypia  Postoperative diagnosis: Same  Procedure right breast seed localized lumpectomy  Surgeon: Maisie Fus Reesha Debes  Anesthesia: LMA with 0.25% Marcaine plain  EBL: Minimal  Specimen: Right breast tissue with seed and clip verified by Faxitron  Drains: None  IV fluids: Per anesthesia record  Indications for procedure: Patient presents for right breast seed lumpectomy due to slight atypia diagnosed by mammogram and core biopsy.  We discussed the pros and cons of removing this lesion as well as potential upgrade risk.  Risks and benefits as well as observation were all discussed and she opted for right breast seed localized lumpectomy.The procedure has been discussed with the patient. Alternatives to surgery have been discussed with the patient.  Risks of surgery include bleeding,  Infection,  Seroma formation, death,  and the need for further surgery.   The patient understands and wishes to proceed.    Description of procedure: Patient was met in the holding area and questions were answered.  Admitted the seed was placed in the right breast as an outpatient.  All questions were answered the right breast was marked as the correct site.  The patient was taken back to the operative room.  They were placed upon the OR table.  After induction, right breast was prepped and draped in sterile fashion and timeout performed.  Proper patient, site and procedure verified.  Neoprobe used to identify the seed was seen in the in the subareolar position.  A curvilinear incision was made along the inferior border of the nipple areolar complex.  Dissection was carried nipple until the seed was identified by the probe.  This was excised with grossly negative margins.  The Faxitron revealed the seed and clip in specimen.  The cavity was irrigated.  Local anesthetic was.  The deep tissue planes were approximated with 3-0 Vicryl.  4 Monocryl was used to close the skin in a  subcuticular fashion.  Dermabond was applied.  Breast binder placed.  All counts were correct.  Patient was awoke, extubated and taken to recovery in satisfactory condition

## 2023-02-17 NOTE — Anesthesia Procedure Notes (Signed)
Procedure Name: LMA Insertion Date/Time: 02/17/2023 7:34 AM  Performed by: Karen Kitchens, CRNAPre-anesthesia Checklist: Patient identified, Emergency Drugs available, Suction available and Patient being monitored Patient Re-evaluated:Patient Re-evaluated prior to induction Oxygen Delivery Method: Circle system utilized Preoxygenation: Pre-oxygenation with 100% oxygen Induction Type: IV induction Ventilation: Mask ventilation without difficulty LMA: LMA inserted LMA Size: 3.0 Number of attempts: 1 Airway Equipment and Method: Bite block Placement Confirmation: positive ETCO2, breath sounds checked- equal and bilateral and CO2 detector Tube secured with: Tape Dental Injury: Teeth and Oropharynx as per pre-operative assessment

## 2023-02-17 NOTE — Transfer of Care (Signed)
Immediate Anesthesia Transfer of Care Note  Patient: Debbie Delgado  Procedure(s) Performed: RIGHT BREAST LUMPECTOMY WITH RADIOACTIVE SEED LOCALIZATION (Right: Breast)  Patient Location: PACU  Anesthesia Type:General  Level of Consciousness: awake, alert , and oriented  Airway & Oxygen Therapy: Patient Spontanous Breathing and Patient connected to face mask oxygen  Post-op Assessment: Report given to RN and Post -op Vital signs reviewed and stable  Post vital signs: Reviewed and stable  Last Vitals:  Vitals Value Taken Time  BP    Temp    Pulse 75 02/17/23 0809  Resp 21 02/17/23 0809  SpO2 98 % 02/17/23 0809  Vitals shown include unfiled device data.  Last Pain:  Vitals:   02/17/23 0647  TempSrc: Temporal      Patients Stated Pain Goal: 3 (02/17/23 5956)  Complications: No notable events documented.

## 2023-02-17 NOTE — H&P (Signed)
History of Present Illness: Debbie Delgado is a 52 y.o. female who is seen today as an office consultation for evaluation of New Consultation and Breast Cancer  Patient presents for evaluation of abnormal mammogram. Was her first mammogram. Patient found to have an area of microcalcifications and distortion right breast upper outer quadrant. Core biopsy showed flat epithelial atypia. No family history of breast cancer since she is adopted and does not know 1. No other complaints today.  Review of Systems: A complete review of systems was obtained from the patient. I have reviewed this information and discussed as appropriate with the patient. See HPI as well for other ROS.    Medical History: Past Medical History:  Diagnosis Date  Anxiety  Arthritis  Asthma, unspecified asthma severity, unspecified whether complicated, unspecified whether persistent ([facility]HS-HCC)  History of cancer   There is no problem list on file for this patient.  Past Surgical History:  Procedure Laterality Date  BREAST EXCISIONAL BIOPSY Right  ENDOSCOPIC CARPAL TUNNEL RELEASE  TONSILLECTOMY    Not on File  Current Outpatient Medications on File Prior to Visit  Medication Sig Dispense Refill  azelastine (ASTELIN) 137 mcg nasal spray USE 2 SPRAY(S) IN EACH NOSTRIL TWICE DAILY AS DIRECTED  carbinoxamine 4 mg tablet Take 4 mg by mouth 3 (three) times daily as needed  EPINEPHrine (EPIPEN) 0.3 mg/0.3 mL auto-injector Inject into the muscle  EYSUVIS 0.25 % DrpS INSTILL 1 INTO EACH EYE 4 TIMES DAILY FOR 7 DAYS, THEN 1 INTO EACH EYE THREE TIMES DAILY FOR 7 DAYS, THEN 1 INTO EACH EYE TWICE DAILY FOR 7 DAYS, THEN 1 ONCE DAILY INTO EACH EYE FOR 7 DAYS.  nystatin (MYCOSTATIN) 100,000 unit/gram cream Apply topically  RESTASIS 0.05 % ophthalmic emulsion Place 1 drop into both eyes 2 (two) times daily   No current facility-administered medications on file prior to visit.   Family History  Problem Relation Age of Onset   Heart valve disease Father  Heart valve disease Sister    Social History   Tobacco Use  Smoking Status Every Day  Types: Cigarettes  Smokeless Tobacco Never    Social History   Socioeconomic History  Marital status: Divorced  Tobacco Use  Smoking status: Every Day  Types: Cigarettes  Smokeless tobacco: Never  Substance and Sexual Activity  Alcohol use: Yes  Drug use: Yes  Types: Marijuana   Social Determinants of Health   Stress: No Stress Concern Present (04/16/2021)  Received from [facility] - Occupational Stress Questionnaire  Feeling of Stress : Not at all  Received from [facility]  Social Network   Objective:   Vitals:  12/29/22 1135  BP: 123/85  Pulse: 58  Temp: 36.7 C (98 F)  SpO2: 98%  Weight: 69.4 kg (153 lb)  Height: 152.4 cm (5')  PainSc: 0-No pain   Body mass index is 29.88 kg/m.  Physical Exam Exam conducted with a chaperone present.  HENT:  Head: Normocephalic.  Mouth/Throat:  Mouth: Mucous membranes are moist.  Cardiovascular:  Rate and Rhythm: Normal rate.  Pulmonary:  Effort: Pulmonary effort is normal.  Chest:  Breasts: Right: Normal.  Left: Normal.  Lymphadenopathy:  Upper Body:  Right upper body: No supraclavicular or axillary adenopathy.  Left upper body: No supraclavicular or axillary adenopathy.  Skin: General: Skin is warm.  Neurological:  General: No focal deficit present.  Mental Status: She is alert.     Labs, Imaging and Diagnostic Testing:  Diagnosis  Breast, right, needle core biopsy, inner, X clip FLAT EPITHELIAL ATYPIA WITH ASSOCIATED CALCIFICATION. FIBROCYSTIC CHANGES INCLUDING STROMAL FIBROSIS, ADENOSIS WITH ASSOCIATED CALCIFICATION, AND MICROCYSTS. NEGATIVE FOR MALIGNANCY. Haiyan MD Lu Pathologist, Electronic Signature EXAM: DIGITAL SCREENING BILATERAL MAMMOGRAM WITH TOMOSYNTHESIS AND CAD  TECHNIQUE: Bilateral screening digital craniocaudal and  mediolateral oblique mammograms were obtained. Bilateral screening digital breast tomosynthesis was performed. The images were evaluated with computer-aided detection.  COMPARISON: None.  ACR Breast Density Category b: There are scattered areas of fibroglandular density.  FINDINGS: In the right breast, a possible asymmetry as well as microcalcifications warrants further evaluation. In the left breast, no findings suspicious for malignancy.  IMPRESSION: Further evaluation is suggested for possible asymmetry as well as microcalcifications in the right breast.  RECOMMENDATION: Diagnostic mammogram and possibly ultrasound of the right breast. (Code:[id number])  The patient will be contacted regarding the findings, and additional imaging will be scheduled.  BI-RADS CATEGORY 0: Incomplete: Need additional imaging evaluation.   Electronically Signed By: Elberta Fortis M.D. On: 11/06/2022 12:13   Assessment and Plan:   Diagnoses and all orders for this visit:  Flat epithelial atypia of breast, right   Recommend right breast seed lumpectomy due to atypia. Risks and benefits reviewed. She agrees to proceed.The procedure has been discussed with the patient. Alternatives to surgery have been discussed with the patient. Risks of surgery include bleeding, Infection, Seroma formation, death, and the need for further surgery. The patient understands and wishes to proceed.    Hayden Rasmussen, MD

## 2023-02-17 NOTE — Discharge Instructions (Addendum)
[facility] Office Phone Number [phone]  BREAST BIOPSY/ PARTIAL MASTECTOMY: POST OP INSTRUCTIONS  Always review your discharge instruction sheet given to you by the facility where your surgery was performed.  IF YOU HAVE DISABILITY OR FAMILY LEAVE FORMS, YOU MUST BRING THEM TO THE OFFICE FOR PROCESSING.  DO NOT GIVE THEM TO YOUR DOCTOR.  A prescription for pain medication may be given to you upon discharge.  Take your pain medication as prescribed, if needed.  If narcotic pain medicine is not needed, then you may take acetaminophen (Tylenol) or ibuprofen (Advil) as needed. Take your usually prescribed medications unless otherwise directed If you need a refill on your pain medication, please contact your pharmacy.  They will contact our office to request authorization.  Prescriptions will not be filled after 5pm or on week-ends. You should eat very light the first 24 hours after surgery, such as soup, crackers, pudding, etc.  Resume your normal diet the day after surgery. Most patients will experience some swelling and bruising in the breast.  Ice packs and a good support bra will help.  Swelling and bruising can take several days to resolve.  It is common to experience some constipation if taking pain medication after surgery.  Increasing fluid intake and taking a stool softener will usually help or prevent this problem from occurring.  A mild laxative (Milk of Magnesia or Miralax) should be taken according to package directions if there are no bowel movements after 48 hours. Unless discharge instructions indicate otherwise, you may remove your bandages 24-48 hours after surgery, and you may shower at that time.  You may have steri-strips (small skin tapes) in place directly over the incision.  These strips should be left on the skin for 7-10 days.  If your surgeon used skin glue on the incision, you may shower in 24 hours.  The glue will flake off over the next 2-3 weeks.  Any  sutures or staples will be removed at the office during your follow-up visit. ACTIVITIES:  You may resume regular daily activities (gradually increasing) beginning the next day.  Wearing a good support bra or sports bra minimizes pain and swelling.  You may have sexual intercourse when it is comfortable. You may drive when you no longer are taking prescription pain medication, you can comfortably wear a seatbelt, and you can safely maneuver your car and apply brakes. RETURN TO WORK:  ______________________________________________________________________________________ You should see your doctor in the office for a follow-up appointment approximately two weeks after your surgery.  Your doctor's nurse will typically make your follow-up appointment when she calls you with your pathology report.  Expect your pathology report 2-3 business days after your surgery.  You may call to check if you do not hear from us after three days. OTHER INSTRUCTIONS: _______________________________________________________________________________________________ _____________________________________________________________________________________________________________________________________ _____________________________________________________________________________________________________________________________________ _____________________________________________________________________________________________________________________________________  WHEN TO CALL YOUR DOCTOR: Fever over 101.0 Nausea and/or vomiting. Extreme swelling or bruising. Continued bleeding from incision. Increased pain, redness, or drainage from the incision.  The clinic staff is available to answer your questions during regular business hours.  Please don't hesitate to call and ask to speak to one of the nurses for clinical concerns.  If you have a medical emergency, go to the nearest emergency room or call 911.  A surgeon from [facility] is always on call at the hospital.  For further questions, please visit [email or web address]    Post Anesthesia Home Care Instructions  Activity: Get plenty of rest for the remainder of   the day. A responsible individual must stay with you for 24 hours following the procedure.  For the next 24 hours, DO NOT: -Drive a car -Operate machinery -Drink alcoholic beverages -Take any medication unless instructed by your physician -Make any legal decisions or sign important papers.  Meals: Start with liquid foods such as gelatin or soup. Progress to regular foods as tolerated. Avoid greasy, spicy, heavy foods. If nausea and/or vomiting occur, drink only clear liquids until the nausea and/or vomiting subsides. Call your physician if vomiting continues.  Special Instructions/Symptoms: Your throat may feel dry or sore from the anesthesia or the breathing tube placed in your throat during surgery. If this causes discomfort, gargle with warm salt water. The discomfort should disappear within 24 hours.  If you had a scopolamine patch placed behind your ear for the management of post- operative nausea and/or vomiting:  1. The medication in the patch is effective for 72 hours, after which it should be removed.  Wrap patch in a tissue and discard in the trash. Wash hands thoroughly with soap and water. 2. You may remove the patch earlier than 72 hours if you experience unpleasant side effects which may include dry mouth, dizziness or visual disturbances. 3. Avoid touching the patch. Wash your hands with soap and water after contact with the patch.      

## 2023-02-17 NOTE — Anesthesia Postprocedure Evaluation (Signed)
Anesthesia Post Note  Patient: Debbie Delgado  Procedure(s) Performed: RIGHT BREAST LUMPECTOMY WITH RADIOACTIVE SEED LOCALIZATION (Right: Breast)     Patient location during evaluation: PACU Anesthesia Type: General Level of consciousness: awake and alert Pain management: pain level controlled Vital Signs Assessment: post-procedure vital signs reviewed and stable Respiratory status: spontaneous breathing, nonlabored ventilation and respiratory function stable Cardiovascular status: blood pressure returned to baseline Postop Assessment: no apparent nausea or vomiting Anesthetic complications: no   No notable events documented.  Last Vitals:  Vitals:   02/17/23 0815 02/17/23 0830  BP: 127/76 117/79  Pulse: 75 66  Resp: 18 10  Temp:    SpO2: 96% 96%    Last Pain:  Vitals:   02/17/23 0830  TempSrc:   PainSc: 3                  Shanda Howells

## 2023-02-18 ENCOUNTER — Encounter (HOSPITAL_BASED_OUTPATIENT_CLINIC_OR_DEPARTMENT_OTHER): Payer: Self-pay | Admitting: Surgery

## 2023-02-18 ENCOUNTER — Encounter: Payer: Self-pay | Admitting: Surgery

## 2023-02-18 LAB — SURGICAL PATHOLOGY

## 2023-02-19 NOTE — Telephone Encounter (Signed)
Can we please get this taken care of tomorrow 02/20/2023.

## 2023-02-24 NOTE — Telephone Encounter (Signed)
Dr. Dellis Anes has completed FMLA forms and they have been given back to a nurse to take to the [facility] office for patient pickup. Copies have been placed in bulk scanning. Patient plans to pick up forms tomorrow (02/25/23) from [facility] at her shot appointment.

## 2023-02-25 ENCOUNTER — Ambulatory Visit (INDEPENDENT_AMBULATORY_CARE_PROVIDER_SITE_OTHER): Payer: Medicaid Other

## 2023-02-25 DIAGNOSIS — J309 Allergic rhinitis, unspecified: Secondary | ICD-10-CM

## 2023-03-04 ENCOUNTER — Ambulatory Visit (INDEPENDENT_AMBULATORY_CARE_PROVIDER_SITE_OTHER): Payer: Medicaid Other

## 2023-03-04 DIAGNOSIS — J309 Allergic rhinitis, unspecified: Secondary | ICD-10-CM

## 2023-03-11 ENCOUNTER — Telehealth: Payer: Self-pay

## 2023-03-11 NOTE — Telephone Encounter (Signed)
Patient called just to update the shot room that she will have to miss her injection this week. Currently patients car is broke down and in the shop. She will try to be here next week.

## 2023-03-18 ENCOUNTER — Ambulatory Visit (INDEPENDENT_AMBULATORY_CARE_PROVIDER_SITE_OTHER): Payer: Medicaid Other

## 2023-03-18 DIAGNOSIS — J309 Allergic rhinitis, unspecified: Secondary | ICD-10-CM | POA: Diagnosis not present

## 2023-03-25 ENCOUNTER — Ambulatory Visit (INDEPENDENT_AMBULATORY_CARE_PROVIDER_SITE_OTHER): Payer: Medicaid Other

## 2023-03-25 DIAGNOSIS — J309 Allergic rhinitis, unspecified: Secondary | ICD-10-CM | POA: Diagnosis not present

## 2023-04-01 ENCOUNTER — Ambulatory Visit (INDEPENDENT_AMBULATORY_CARE_PROVIDER_SITE_OTHER): Payer: Medicaid Other

## 2023-04-01 DIAGNOSIS — J309 Allergic rhinitis, unspecified: Secondary | ICD-10-CM | POA: Diagnosis not present

## 2023-04-08 ENCOUNTER — Ambulatory Visit (INDEPENDENT_AMBULATORY_CARE_PROVIDER_SITE_OTHER): Payer: Medicaid Other

## 2023-04-08 DIAGNOSIS — J309 Allergic rhinitis, unspecified: Secondary | ICD-10-CM

## 2023-04-15 ENCOUNTER — Ambulatory Visit (INDEPENDENT_AMBULATORY_CARE_PROVIDER_SITE_OTHER): Payer: Self-pay

## 2023-04-15 DIAGNOSIS — J309 Allergic rhinitis, unspecified: Secondary | ICD-10-CM | POA: Diagnosis not present

## 2023-04-22 ENCOUNTER — Ambulatory Visit (INDEPENDENT_AMBULATORY_CARE_PROVIDER_SITE_OTHER): Payer: Medicaid Other

## 2023-04-22 DIAGNOSIS — J309 Allergic rhinitis, unspecified: Secondary | ICD-10-CM | POA: Diagnosis not present

## 2023-04-24 ENCOUNTER — Emergency Department (HOSPITAL_COMMUNITY): Payer: Medicaid Other

## 2023-04-24 ENCOUNTER — Other Ambulatory Visit: Payer: Self-pay

## 2023-04-24 ENCOUNTER — Emergency Department (HOSPITAL_COMMUNITY)
Admission: EM | Admit: 2023-04-24 | Discharge: 2023-04-24 | Disposition: A | Discharge status: Home / Self Care | Payer: Medicaid Other

## 2023-04-24 DIAGNOSIS — Z720 Tobacco use: Secondary | ICD-10-CM | POA: Diagnosis not present

## 2023-04-24 DIAGNOSIS — Y9301 Activity, walking, marching and hiking: Secondary | ICD-10-CM | POA: Insufficient documentation

## 2023-04-24 DIAGNOSIS — J45909 Unspecified asthma, uncomplicated: Secondary | ICD-10-CM | POA: Insufficient documentation

## 2023-04-24 DIAGNOSIS — S82851A Displaced trimalleolar fracture of right lower leg, initial encounter for closed fracture: Secondary | ICD-10-CM | POA: Insufficient documentation

## 2023-04-24 DIAGNOSIS — S99911A Unspecified injury of right ankle, initial encounter: Secondary | ICD-10-CM | POA: Diagnosis present

## 2023-04-24 DIAGNOSIS — R000 Tachycardia, unspecified: Secondary | ICD-10-CM | POA: Insufficient documentation

## 2023-04-24 DIAGNOSIS — S82891A Other fracture of right lower leg, initial encounter for closed fracture: Secondary | ICD-10-CM

## 2023-04-24 DIAGNOSIS — Y92008 Other place in unspecified non-institutional (private) residence as the place of occurrence of the external cause: Secondary | ICD-10-CM | POA: Diagnosis not present

## 2023-04-24 DIAGNOSIS — X500XXA Overexertion from strenuous movement or load, initial encounter: Secondary | ICD-10-CM | POA: Insufficient documentation

## 2023-04-24 DIAGNOSIS — Z91040 Latex allergy status: Secondary | ICD-10-CM | POA: Diagnosis not present

## 2023-04-24 DIAGNOSIS — Z859 Personal history of malignant neoplasm, unspecified: Secondary | ICD-10-CM | POA: Diagnosis not present

## 2023-04-24 MED ORDER — PROPOFOL 10 MG/ML IV BOLUS
20.0000 mg | Freq: Once | INTRAVENOUS | Status: AC
  Administered 2023-04-24: 20 mg via INTRAVENOUS

## 2023-04-24 MED ORDER — LIDOCAINE HCL (PF) 1 % IJ SOLN
10.0000 mL | Freq: Once | INTRAMUSCULAR | Status: DC
  Filled 2023-04-24: qty 10

## 2023-04-24 MED ORDER — KETAMINE HCL 50 MG/5ML IJ SOSY
1.0000 mg/kg | PREFILLED_SYRINGE | Freq: Once | INTRAMUSCULAR | Status: AC
  Administered 2023-04-24: 50 mg via INTRAVENOUS

## 2023-04-24 MED ORDER — KETAMINE HCL 50 MG/5ML IJ SOSY
PREFILLED_SYRINGE | INTRAMUSCULAR | Status: AC
  Filled 2023-04-24: qty 5

## 2023-04-24 MED ORDER — IBUPROFEN 600 MG PO TABS: 600.0000 mg | ORAL_TABLET | Freq: Four times a day (QID) | ORAL | 0 refills | Status: DC | PRN

## 2023-04-24 MED ORDER — OXYCODONE HCL 5 MG PO TABS
10.0000 mg | ORAL_TABLET | Freq: Once | ORAL | Status: AC
  Administered 2023-04-24: 10 mg via ORAL
  Filled 2023-04-24: qty 2

## 2023-04-24 MED ORDER — HYDROMORPHONE HCL 1 MG/ML IJ SOLN
1.0000 mg | Freq: Once | INTRAMUSCULAR | Status: AC
  Administered 2023-04-24: 1 mg via INTRAVENOUS
  Filled 2023-04-24: qty 1

## 2023-04-24 MED ORDER — OXYCODONE HCL 5 MG PO TABS
5.0000 mg | ORAL_TABLET | ORAL | 0 refills | Status: DC | PRN
Start: 2023-04-24 — End: 2023-04-25

## 2023-04-24 MED ORDER — PROPOFOL 10 MG/ML IV BOLUS
1.0000 mg/kg | Freq: Once | INTRAVENOUS | Status: AC
  Administered 2023-04-24: 69.9 mg via INTRAVENOUS
  Filled 2023-04-24: qty 20

## 2023-04-24 MED ORDER — HYDROMORPHONE HCL 1 MG/ML IJ SOLN: 1.0000 mg | Freq: Once | INTRAMUSCULAR | Status: DC

## 2023-04-24 MED ORDER — PROPOFOL 10 MG/ML IV BOLUS
40.0000 mg | Freq: Once | INTRAVENOUS | Status: AC
  Administered 2023-04-24: 40 mg via INTRAVENOUS

## 2023-04-24 MED ORDER — PROPOFOL 10 MG/ML IV BOLUS
30.0000 mg | Freq: Once | INTRAVENOUS | Status: AC
  Administered 2023-04-24: 30 mg via INTRAVENOUS

## 2023-04-24 NOTE — ED Triage Notes (Signed)
Pt fell going down porch. Did not hit head. Had obvious deformity noted to right ankle. Pulse present. Was given total of fentanyl in route. With 500ns. Able to feel touch to toes but states can not move them. Pt arrived hyperventilating/diaphoretic. Unable to console.

## 2023-04-24 NOTE — ED Notes (Signed)
Attempted to get pt in w/c. Pt stated she was in extreme pain and started to hyperventilate. Pt states "I can't do this". PA aware.

## 2023-04-24 NOTE — Discharge Instructions (Signed)
As discussed, I have talked to Dr. Dallas Schimke regarding your case to looked at the postreduction films and feels like your ankle was in adequate place to wait until surgery occurs on Monday.  It is very important for you to keep your ankle above the level of your heart as much as possible to help with swelling as well as ice area.  You may take Motrin for baseline pain with pain medication for breakthrough pain.  He will reach out to you over the weekend with more details regarding surgery.  Make sure to not eat or drink anything after midnight on Sunday night.  Please not hesitate to return to emergency department for worrisome signs and symptoms we discussed become apparent.

## 2023-04-24 NOTE — ED Notes (Signed)
Pt much more calm and restful

## 2023-04-24 NOTE — ED Notes (Signed)
PA in talking with pt

## 2023-04-24 NOTE — ED Notes (Signed)
Dilaudid is starting to help pt calm down

## 2023-04-24 NOTE — ED Notes (Signed)
Pt hooked to cardiac monitor and co2. Awaiting reduction

## 2023-04-24 NOTE — ED Provider Notes (Signed)
[place] EMERGENCY DEPARTMENT AT [facility] Provider Note   CSN: [id number] Arrival date & time: 04/24/23  1326     History  Chief Complaint  Patient presents with   Ankle Pain    Debbie Delgado is a 52 y.o. female.   Ankle Pain   52 year old female presents emergency department complaints of right ankle pain.  Patient states that she was walking on the porch and she tripped catching her foot on a step.  She states that her "foot twisted backwards".  States that she has had pain since that.  Received 200 mcg of fentanyl via EMS prior to arrival but patient is tolerating appreciable pain.  Denies any trauma to head, loss consciousness, chest pain, abdominal pain, back pain, upper/lower extremity pain otherwise.  Past medical history significant for asthma, cancer, fibroid,  Home Medications Prior to Admission medications   Medication Sig Start Date End Date Taking? Authorizing Provider  doxycycline (MONODOX) 100 MG capsule Take 100 mg by mouth 2 (two) times daily. 03/03/23  Yes [provider]  ibuprofen (ADVIL) 600 MG tablet Take 1 tablet (600 mg total) by mouth every 6 (six) hours as needed. 04/24/23  Yes Sherian Maroon A, PA  nystatin (MYCOSTATIN) 100000 UNIT/ML suspension Take 10 mLs by mouth 3 (three) times daily. 03/26/23  Yes [provider]  oxyCODONE (ROXICODONE) 5 MG immediate release tablet Take 1 tablet (5 mg total) by mouth every 4 (four) hours as needed for severe pain. 04/24/23  Yes Sherian Maroon A, PA  azelastine (ASTELIN) 0.1 % nasal spray Place 2 sprays into both nostrils 2 (two) times daily. Use in each nostril as directed 02/02/23   Alfonse Spruce, MD  calcium carbonate (OS-CAL) 1250 (500 Ca) MG chewable tablet Chew 1 tablet by mouth daily.    [provider]  cephALEXin (KEFLEX) 500 MG capsule Take 500 mg by mouth 2 (two) times daily. 07/29/22   [provider]  cycloSPORINE (RESTASIS) 0.05 % ophthalmic emulsion  Place 1 drop into both eyes every 12 (twelve) hours.    [provider]  cycloSPORINE (RESTASIS) 0.05 % ophthalmic emulsion Administer 1 drop into both eyes every 12 (twelve) hours.    [provider]  EPINEPHrine (EPIPEN 2-PAK) 0.3 mg/0.3 mL IJ SOAJ injection Inject 0.3 mg into the muscle as needed for anaphylaxis. 12/05/22   Alfonse Spruce, MD  EYSUVIS 0.25 % SUSP INSTILL 1 INTO EACH EYE 4 TIMES DAILY FOR 7 DAYS, THEN 1 INTO EACH EYE THREE TIMES DAILY FOR 7 DAYS, THEN 1 INTO EACH EYE TWICE DAILY FOR 7 DAYS, THEN 1 ONCE DAILY INTO EACH EYE FOR 7 DAYS. 06/28/22   [provider]  predniSONE (STERAPRED UNI-PAK 21 TAB) 10 MG (21) TBPK tablet Take 20 mg by mouth daily. 03/17/23   [provider]      Allergies    Latex, Tylenol [acetaminophen], and Codeine    Review of Systems   Review of Systems  All other systems reviewed and are negative.   Physical Exam Updated Vital Signs BP 126/87 (BP Location: Left Arm)   Pulse 79   Temp (!) 97.5 F (36.4 C) (Oral)   Resp 20   Wt 69.9 kg   LMP 05/22/2017   SpO2 95%   BMI 30.08 kg/m  Physical Exam Vitals and nursing note reviewed.  Constitutional:      General: She is not in acute distress.    Appearance: She is well-developed.  HENT:  Head: Normocephalic and atraumatic.  Eyes:     Conjunctiva/sclera: Conjunctivae normal.  Cardiovascular:     Rate and Rhythm: Regular rhythm. Tachycardia present.  Pulmonary:     Effort: Pulmonary effort is normal. No respiratory distress.     Breath sounds: Normal breath sounds.  Abdominal:     Palpations: Abdomen is soft.     Tenderness: There is no abdominal tenderness.  Musculoskeletal:        General: Deformity present.     Cervical back: Neck supple.     Comments: With obvious deformity of right ankle diffuse tenderness to palpation.  Pedal pulses 2+ bilaterally.  Patient with intact sensation distal to injury.  Unable to assess range of motion.   Otherwise, no midline tenderness of cervical, thoracic and lumbar spine with obvious step-off or Forei noted.  No chest wall tenderness to palpation.  Tenderness palpation of upper extremities.  Besides aforementioned, no tenderness of right lower or left lower extremity.  Skin:    General: Skin is warm and dry.     Capillary Refill: Capillary refill takes less than 2 seconds.  Neurological:     Mental Status: She is alert.  Psychiatric:        Mood and Affect: Mood normal.     ED Results / Procedures / Treatments   Labs (all labs ordered are listed, but only abnormal results are displayed) Labs Reviewed - No data to display  EKG None  Radiology DG Ankle 2 Views Right  Result Date: 04/24/2023 CLINICAL DATA:  Trimalleolar fracture.  Postreduction. EXAM: RIGHT ANKLE - 2 VIEW COMPARISON:  Radiographs same date. FINDINGS: Interval closed reduction and splinting of the previously demonstrated trimalleolar fracture for the overall alignment has improved, with less widening of the ankle mortise. There is persistent posterior dislocation of the talar dome relative to the tibial plafond. No new fractures are identified. IMPRESSION: Improved alignment of the trimalleolar fracture dislocation post closed reduction and splinting. Persistent posterior tibiotalar dislocation. Electronically Signed   By: Carey Bullocks M.D.   On: 04/24/2023 16:09   DG Ankle Complete Right  Result Date: 04/24/2023 CLINICAL DATA:  Severe ankle pain and deformity after slipping and falling. EXAM: RIGHT ANKLE - COMPLETE 3+ VIEW COMPARISON:  None Available. FINDINGS: There is a trimalleolar fracture dislocation with posterior and lateral dislocation of the talus relative to the tibial plafond. Oblique fracture of the distal fibular diaphysis demonstrates moderate lateral, posterior and proximal displacement. There is a posteriorly displaced fracture of the posterior malleolus. The medial malleolus is laterally displaced.  There is widening of the ankle mortise. The talar dome appears intact, and no tarsal bone fractures are identified. No evidence of foreign body or soft tissue emphysema. IMPRESSION: Trimalleolar fracture dislocation of the right ankle as described. Electronically Signed   By: Carey Bullocks M.D.   On: 04/24/2023 16:08    Procedures .Ortho Injury Treatment  Date/Time: 04/24/2023 3:24 PM  Performed by: Peter Garter, PA Authorized by: Peter Garter, PA   Consent:    Consent obtained:  Verbal and written   Consent given by:  Patient   Risks discussed:  Fracture, irreducible dislocation, recurrent dislocation, nerve damage, stiffness, restricted joint movement and vascular damage   Alternatives discussed:  No treatment, delayed treatment, alternative treatment, immobilization and referralInjury location: ankle Location details: right ankle Injury type: fracture-dislocation Pre-procedure distal perfusion: normal Pre-procedure neurological function: normal Pre-procedure range of motion: reduced  Anesthesia: Local anesthesia used: no  Patient sedated: Yes. Refer  to sedation procedure documentation for details of sedation. Manipulation performed: yes Skeletal traction used: yes Reduction successful: no Immobilization: splint Splint type: short leg Splint Applied by: ED Provider Supplies used: cotton padding, Ortho-Glass and elastic bandage Post-procedure distal perfusion: normal Post-procedure neurological function: normal Post-procedure range of motion: unchanged       Medications Ordered in ED Medications  HYDROmorphone (DILAUDID) injection 1 mg (1 mg Intravenous Given 04/24/23 1338)  propofol (DIPRIVAN) 10 mg/mL bolus/IV push 69.9 mg (69.9 mg Intravenous Given 04/24/23 1453)  ketamine 50 mg in normal saline 5 mL (10 mg/mL) syringe (50 mg Intravenous Given 04/24/23 1504)  HYDROmorphone (DILAUDID) injection 1 mg (1 mg Intravenous Given 04/24/23 1530)  propofol (DIPRIVAN) 10  mg/mL bolus/IV push 20 mg (20 mg Intravenous Given 04/24/23 1457)  propofol (DIPRIVAN) 10 mg/mL bolus/IV push 30 mg (30 mg Intravenous Given 04/24/23 1459)  propofol (DIPRIVAN) 10 mg/mL bolus/IV push 40 mg (40 mg Intravenous Given 04/24/23 1501)  oxyCODONE (Oxy IR/ROXICODONE) immediate release tablet 10 mg (10 mg Oral Given 04/24/23 1705)    ED Course/ Medical Decision Making/ A&P Clinical Course as of 04/24/23 1836  Fri Apr 24, 2023  1524 Attempted reduction with slight improvement of dislocation.  Consulted orthopedics who recommended patient be discharged with surgical intervention planned on Monday. [CR]    Clinical Course User Index [CR] Peter Garter, PA                                 Medical Decision Making Amount and/or Complexity of Data Reviewed Radiology: ordered.  Risk Prescription drug management.   This patient presents to the ED for concern of ankle pain, this involves an extensive number of treatment options, and is a complaint that carries with it a high risk of complications and morbidity.  The differential diagnosis includes fracture, strain/pain, dislocation, ligamentous/tendinous injury, neurovascular compromise   Co morbidities that complicate the patient evaluation  See HPI   Additional history obtained:  Additional history obtained from EMR External records from outside source obtained and reviewed including hospital records   Lab Tests:  N/a   Imaging Studies ordered:  I ordered imaging studies including right ankle x-ray I independently visualized and interpreted imaging which showed  Right ankle prereduction: Trimalleolar fracture dislocation Postreduction: Improved alignment of trimalleolar fracture dislocation.  Persistent posterior tibial talar dislocation. I agree with the radiologist interpretation   Cardiac Monitoring: / EKG:  The patient was maintained on a cardiac monitor.  I personally viewed and interpreted the cardiac  monitored which showed an underlying rhythm of: Sinus rhythm   Consultations Obtained:  N/a   Problem List / ED Course / Critical interventions / Medication management  Ankle fracture I ordered medication including Dilaudid, propofol, ketamine, hydromorphone, oxycodone   Reevaluation of the patient after these medicines showed that the patient improved I have reviewed the patients home medicines and have made adjustments as needed   Social Determinants of Health:  Chronic cigarette use.  Denies illicit drug use.   Test / Admission - Considered:  Ankle fracture Vitals signs significant for . Otherwise within normal range and stable throughout visit. Imaging studies significant for: See above 52 year old female presents emergency department with complaints of right ankle pain after suffering mechanical fall.  On exam, patient initially with deformed right ankle consistent with most likely fracture dislocation.  X-ray was obtained initially which did show trimalleolar fracture with evidence of dislocation as above  mention.  Reduction was performed with partial success.  Consulted orthopedics who recommended discharge with surgical intervention on Monday.  Will recommend rest, ice, elevation, NSAIDs for baseline pain with pain medication for breakthrough pain.  Strict return precautions discussed at length.  Treatment plan discussed at length with patient and she acknowledged understand was agreeable to said plan.  Patient overall well-appearing, afebrile in no acute distress. Worrisome signs and symptoms were discussed with the patient, and the patient acknowledged understanding to return to the ED if noticed. Patient was stable upon discharge.          Final Clinical Impression(s) / ED Diagnoses Final diagnoses:  Closed fracture of right ankle, initial encounter    Rx / DC Orders ED Discharge Orders          Ordered    oxyCODONE (ROXICODONE) 5 MG immediate release tablet   Every 4 hours PRN        04/24/23 1745    ibuprofen (ADVIL) 600 MG tablet  Every 6 hours PRN        04/24/23 1745              Peter Garter, PA 04/24/23 1836    Coral Spikes, DO 04/27/23 1750

## 2023-04-24 NOTE — ED Notes (Signed)
Pt back to baseline. Pt kept taking off 02/ and 02 sensor and body tense during sedation, unable to obtain some bp's. EDP/PA present until after sedation ended. Right ankle was reduced and splinted by EDP/PA.

## 2023-04-25 ENCOUNTER — Emergency Department (HOSPITAL_COMMUNITY): Payer: Medicaid Other

## 2023-04-25 ENCOUNTER — Emergency Department (HOSPITAL_COMMUNITY)
Admission: EM | Admit: 2023-04-25 | Discharge: 2023-04-25 | Disposition: A | Discharge status: Home / Self Care | Payer: Medicaid Other | Attending: Emergency Medicine | Admitting: Emergency Medicine

## 2023-04-25 ENCOUNTER — Encounter (HOSPITAL_COMMUNITY): Payer: Self-pay

## 2023-04-25 ENCOUNTER — Other Ambulatory Visit: Payer: Self-pay

## 2023-04-25 DIAGNOSIS — S82851A Displaced trimalleolar fracture of right lower leg, initial encounter for closed fracture: Secondary | ICD-10-CM | POA: Diagnosis not present

## 2023-04-25 DIAGNOSIS — S8991XD Unspecified injury of right lower leg, subsequent encounter: Secondary | ICD-10-CM | POA: Diagnosis present

## 2023-04-25 DIAGNOSIS — W19XXXA Unspecified fall, initial encounter: Secondary | ICD-10-CM | POA: Diagnosis not present

## 2023-04-25 DIAGNOSIS — W1839XD Other fall on same level, subsequent encounter: Secondary | ICD-10-CM | POA: Insufficient documentation

## 2023-04-25 DIAGNOSIS — S82891K Other fracture of right lower leg, subsequent encounter for closed fracture with nonunion: Secondary | ICD-10-CM | POA: Insufficient documentation

## 2023-04-25 MED ORDER — HYDROMORPHONE HCL 1 MG/ML IJ SOLN
1.0000 mg | Freq: Once | INTRAMUSCULAR | Status: AC
  Administered 2023-04-25: 1 mg via INTRAVENOUS
  Filled 2023-04-25: qty 1

## 2023-04-25 MED ORDER — ONDANSETRON HCL 4 MG/2ML IJ SOLN
4.0000 mg | Freq: Once | INTRAMUSCULAR | Status: AC
  Administered 2023-04-25: 4 mg via INTRAVENOUS
  Filled 2023-04-25: qty 2

## 2023-04-25 MED ORDER — KETOROLAC TROMETHAMINE 30 MG/ML IJ SOLN
30.0000 mg | Freq: Once | INTRAMUSCULAR | Status: AC
  Administered 2023-04-25: 30 mg via INTRAVENOUS
  Filled 2023-04-25: qty 1

## 2023-04-25 MED ORDER — OXYCODONE HCL 5 MG PO TABS
5.0000 mg | ORAL_TABLET | Freq: Four times a day (QID) | ORAL | 0 refills | Status: DC | PRN
Start: 2023-04-25 — End: 2023-04-30

## 2023-04-25 MED ORDER — PROPOFOL 10 MG/ML IV BOLUS
0.5000 mg/kg | Freq: Once | INTRAVENOUS | Status: DC
  Filled 2023-04-25: qty 20

## 2023-04-25 MED ORDER — PROPOFOL 10 MG/ML IV BOLUS
INTRAVENOUS | Status: AC | PRN
Start: 2023-04-25 — End: 2023-04-25
  Administered 2023-04-25: 80 ug via INTRAVENOUS

## 2023-04-25 NOTE — ED Notes (Signed)
The pt is waiting for  a c-t

## 2023-04-25 NOTE — Consult Note (Addendum)
ORTHOPAEDIC CONSULTATION  REQUESTING PHYSICIAN: Linwood Dibbles, MD  Chief Complaint: Right ankle fx dislocation  HPI: Debbie Delgado is a 52 y.o. female who presents with severe pain to right ankle.  Presented to [facility] ED yesterday with ankle fx dislocation.  Reduction attempted by EDP and splinted.  Patient returned to [facility] ED today for continued severe pain.  Ortho consulted.  Past Medical History:  Diagnosis Date   Asthma    Cancer (HCC)    cervical   Complication of anesthesia    "hard to wake"   Ectopic pregnancy    Endometriosis    Fibroid    Past Surgical History:  Procedure Laterality Date   BREAST BIOPSY Right 12/08/2022   MM RT BREAST BX W LOC DEV 1ST LESION IMAGE BX SPEC STEREO GUIDE 12/08/2022 [facility]   BREAST BIOPSY  02/16/2023   MM RT RADIOACTIVE SEED LOC MAMMO GUIDE 02/16/2023 [facility]   BREAST LUMPECTOMY WITH RADIOACTIVE SEED LOCALIZATION Right 02/17/2023   Procedure: RIGHT BREAST LUMPECTOMY WITH RADIOACTIVE SEED LOCALIZATION;  Surgeon: Harriette Bouillon, MD;  Location: [facility];  Service: General;  Laterality: Right;   CARPAL TUNNEL RELEASE     TONSILLECTOMY     Social History   Socioeconomic History   Marital status: Divorced    Spouse name: Not on file   Number of children: Not on file   Years of education: Not on file   Highest education level: Not on file  Occupational History   Not on file  Tobacco Use   Smoking status: Every Day   Smokeless tobacco: Never   Tobacco comments:    1/2 PPD  Substance and Sexual Activity   Alcohol use: Yes    Comment: occasionally   Drug use: No   Sexual activity: Not on file  Other Topics Concern   Not on file  Social History Narrative   Not on file   Social Determinants of Health   Financial Resource Strain: Not on file  Food Insecurity: Not on file  Transportation Needs: Not on file  Physical Activity: Not on file  Stress: No Stress Concern Present (04/16/2021)   Received  from [facility], [facility]   [employer] [facility] Occupational [facility] - Occupational Stress Questionnaire    Feeling of Stress : Not at all  Social Connections: Unknown (12/06/2021)   Received from [employer], [facility]   Social Network    Social Network: Not on file   Family History  Problem Relation Age of Onset   Allergic rhinitis Sister    Breast cancer Maternal Grandmother    Allergic rhinitis Brother    Angioedema Neg Hx    Asthma Neg Hx    Atopy Neg Hx    Eczema Neg Hx    Immunodeficiency Neg Hx    Urticaria Neg Hx    - negative except otherwise stated in the family history section Allergies  Allergen Reactions   Latex Hives   Tylenol [Acetaminophen] Nausea And Vomiting    "I can't take tylenol" NVD   Codeine Itching and Nausea And Vomiting    "can take with promethazine"   Prior to Admission medications   Medication Sig Start Date End Date Taking? Authorizing Provider  azelastine (ASTELIN) 0.1 % nasal spray Place 2 sprays into both nostrils 2 (two) times daily. Use in each nostril as directed 02/02/23   Alfonse Spruce, MD  calcium carbonate (OS-CAL) 1250 (500 Ca) MG chewable tablet Chew 1 tablet by  mouth daily.    [provider]  cephALEXin (KEFLEX) 500 MG capsule Take 500 mg by mouth 2 (two) times daily. 07/29/22   [provider]  cycloSPORINE (RESTASIS) 0.05 % ophthalmic emulsion Place 1 drop into both eyes every 12 (twelve) hours.    [provider]  cycloSPORINE (RESTASIS) 0.05 % ophthalmic emulsion Administer 1 drop into both eyes every 12 (twelve) hours.    [provider]  doxycycline (MONODOX) 100 MG capsule Take 100 mg by mouth 2 (two) times daily. 03/03/23   [provider]  EPINEPHrine (EPIPEN 2-PAK) 0.3 mg/0.3 mL IJ SOAJ injection Inject 0.3 mg into the muscle as needed for anaphylaxis. 12/05/22   Alfonse Spruce, MD  EYSUVIS 0.25 % SUSP INSTILL 1 INTO EACH EYE 4 TIMES DAILY FOR 7 DAYS, THEN 1  INTO EACH EYE THREE TIMES DAILY FOR 7 DAYS, THEN 1 INTO EACH EYE TWICE DAILY FOR 7 DAYS, THEN 1 ONCE DAILY INTO EACH EYE FOR 7 DAYS. 06/28/22   [provider]  ibuprofen (ADVIL) 600 MG tablet Take 1 tablet (600 mg total) by mouth every 6 (six) hours as needed. 04/24/23   Sherian Maroon A, PA  nystatin (MYCOSTATIN) 100000 UNIT/ML suspension Take 10 mLs by mouth 3 (three) times daily. 03/26/23   [provider]  oxyCODONE (ROXICODONE) 5 MG immediate release tablet Take 1 tablet (5 mg total) by mouth every 4 (four) hours as needed for severe pain. 04/24/23   Peter Garter, PA  predniSONE (STERAPRED UNI-PAK 21 TAB) 10 MG (21) TBPK tablet Take 20 mg by mouth daily. 03/17/23   [provider]   DG Ankle Complete Right  Result Date: 04/25/2023 CLINICAL DATA:  Follow-up known ankle fracture EXAM: RIGHT ANKLE - COMPLETE 3+ VIEW COMPARISON:  04/24/2023 FINDINGS: Redemonstration of fracture dislocation at the ankle joint. Oblique fracture of the distal fibula. Transverse fracture of the medial malleolus. Coronal fracture of the posterior lip of the tibia. Talus displaced posterior relative to the normal articulation. IMPRESSION: Trimalleolar fracture dislocation of the right ankle. Splint in place. Electronically Signed   By: Paulina Fusi M.D.   On: 04/25/2023 17:11   DG Ankle 2 Views Right  Result Date: 04/24/2023 CLINICAL DATA:  Trimalleolar fracture.  Postreduction. EXAM: RIGHT ANKLE - 2 VIEW COMPARISON:  Radiographs same date. FINDINGS: Interval closed reduction and splinting of the previously demonstrated trimalleolar fracture for the overall alignment has improved, with less widening of the ankle mortise. There is persistent posterior dislocation of the talar dome relative to the tibial plafond. No new fractures are identified. IMPRESSION: Improved alignment of the trimalleolar fracture dislocation post closed reduction and splinting. Persistent posterior tibiotalar dislocation.  Electronically Signed   By: Carey Bullocks M.D.   On: 04/24/2023 16:09   DG Ankle Complete Right  Result Date: 04/24/2023 CLINICAL DATA:  Severe ankle pain and deformity after slipping and falling. EXAM: RIGHT ANKLE - COMPLETE 3+ VIEW COMPARISON:  None Available. FINDINGS: There is a trimalleolar fracture dislocation with posterior and lateral dislocation of the talus relative to the tibial plafond. Oblique fracture of the distal fibular diaphysis demonstrates moderate lateral, posterior and proximal displacement. There is a posteriorly displaced fracture of the posterior malleolus. The medial malleolus is laterally displaced. There is widening of the ankle mortise. The talar dome appears intact, and no tarsal bone fractures are identified. No evidence of foreign body or soft tissue emphysema. IMPRESSION: Trimalleolar fracture dislocation of the right ankle as described. Electronically Signed  By: Carey Bullocks M.D.   On: 04/24/2023 16:08   - pertinent xrays, CT, MRI studies were reviewed and independently interpreted  Positive ROS: All other systems have been reviewed and were otherwise negative with the exception of those mentioned in the HPI and as above.  Physical Exam: General: No acute distress Cardiovascular: No pedal edema Respiratory: No cyanosis, no use of accessory musculature GI: No organomegaly, abdomen is soft and non-tender Skin: No lesions in the area of chief complaint Neurologic: Sensation intact distally Psychiatric: Patient is at baseline mood and affect Lymphatic: No axillary or cervical lymphadenopathy  MUSCULOSKELETAL:  Right ankle - moderate swelling - skin intact - 2+ DP pulse  Assessment: Right trimal ankle fx dislocation  Plan: - post-reduction xrays from yesterday show dislocated ankle, will need re-reduction  - post-reduction xrays tonight show reduced ankle joint, will obtain CT scan for surgical planning - will follow up with Dr. Dallas Schimke as previously  scheduled   Thank you for the consult and the opportunity to see Ms. Burley Saver. Glee Arvin, MD [facility] 7:06 PM

## 2023-04-25 NOTE — Progress Notes (Signed)
Orthopedic Tech Progress Note Patient Details:  Debbie Delgado October 19, 1970 [id number]  Well-padded plaster posterior short leg splint with stirrup placed to RLE following reduction by Dr. Roda Shutters.   Ortho Devices Type of Ortho Device: Stirrup splint, Post (short leg) splint Ortho Device/Splint Location: RLE Ortho Device/Splint Interventions: Ordered, Application, Adjustment   Post Interventions Patient Tolerated: Fair, Well Instructions Provided: Care of device  Docia Furl 04/25/2023, 7:09 PM

## 2023-04-25 NOTE — Discharge Instructions (Addendum)
As discussed, after reduction your ankle shows stable alignment of your trimalleolar fractures and mild persistent posterior tibial talar dislocation.  CT imaging was done to help orthopedic for surgical planning.  Follow-up with orthopedics, as discussed today.  Make sure you are elevating your leg above heart level to help with the swelling which in turn can help with your pain.  You can continue taking the oxycodone prescribed yesterday as needed for pain.  Get help right away if: You have very bad pain that lasts. You get new pain or swelling. Your skin or toes below the injured ankle: Turn blue or gray. Feel cold. Lose feeling. Have less feeling than normal when something touches them.

## 2023-04-25 NOTE — ED Provider Notes (Signed)
Debbie Delgado EMERGENCY DEPARTMENT AT [facility] Provider Note   CSN: [id number] Arrival date & time: 04/25/23  1247     History  Chief Complaint  Patient presents with   Ankle Injury    Debbie Delgado is a 52 y.o. female with a history of cancer, asthma, and endometriosis who presents to the ED today for right ankle pain.  Patient fell yesterday and was seen at [facility] ED.  She was diagnosed with a trimalleolar fracture with dislocation of the right ankle.  She was discharged home with oxycodone and is scheduled for surgery with orthopedics in 2 days.  Patient reports that last night after being discharged home she fell and landed on her right ankle again.  She denies any worsening pain but states that it has been persistent and the medication prescribed has not helped at all.  No numbness, tingling, loss of sensation to the ankle or foot.    Home Medications Prior to Admission medications   Medication Sig Start Date End Date Taking? Authorizing Provider  azelastine (ASTELIN) 0.1 % nasal spray Place 2 sprays into both nostrils 2 (two) times daily. Use in each nostril as directed 02/02/23   Alfonse Spruce, MD  calcium carbonate (OS-CAL) 1250 (500 Ca) MG chewable tablet Chew 1 tablet by mouth daily.    [provider]  cephALEXin (KEFLEX) 500 MG capsule Take 500 mg by mouth 2 (two) times daily. 07/29/22   [provider]  cycloSPORINE (RESTASIS) 0.05 % ophthalmic emulsion Place 1 drop into both eyes every 12 (twelve) hours.    [provider]  cycloSPORINE (RESTASIS) 0.05 % ophthalmic emulsion Administer 1 drop into both eyes every 12 (twelve) hours.    [provider]  doxycycline (MONODOX) 100 MG capsule Take 100 mg by mouth 2 (two) times daily. 03/03/23   [provider]  EPINEPHrine (EPIPEN 2-PAK) 0.3 mg/0.3 mL IJ SOAJ injection Inject 0.3 mg into the muscle as needed for anaphylaxis. 12/05/22   Alfonse Spruce, MD   EYSUVIS 0.25 % SUSP INSTILL 1 INTO EACH EYE 4 TIMES DAILY FOR 7 DAYS, THEN 1 INTO EACH EYE THREE TIMES DAILY FOR 7 DAYS, THEN 1 INTO EACH EYE TWICE DAILY FOR 7 DAYS, THEN 1 ONCE DAILY INTO EACH EYE FOR 7 DAYS. 06/28/22   [provider]  ibuprofen (ADVIL) 600 MG tablet Take 1 tablet (600 mg total) by mouth every 6 (six) hours as needed. 04/24/23   Sherian Maroon A, PA  nystatin (MYCOSTATIN) 100000 UNIT/ML suspension Take 10 mLs by mouth 3 (three) times daily. 03/26/23   [provider]  oxyCODONE (ROXICODONE) 5 MG immediate release tablet Take 1 tablet (5 mg total) by mouth every 4 (four) hours as needed for severe pain. 04/24/23   Peter Garter, PA  predniSONE (STERAPRED UNI-PAK 21 TAB) 10 MG (21) TBPK tablet Take 20 mg by mouth daily. 03/17/23   [provider]      Allergies    Latex, Tylenol [acetaminophen], and Codeine    Review of Systems   Review of Systems  Musculoskeletal:        Right ankle pain    Physical Exam Updated Vital Signs BP (!) 112/92   Pulse 78   Temp 99.4 F (37.4 C) (Oral)   Resp 20   Ht 5' (1.524 m)   Wt 68 kg   LMP 05/22/2017   SpO2 97%   BMI 29.29 kg/m  Physical Exam Vitals and nursing note  reviewed.  Constitutional:      Appearance: Normal appearance.  HENT:     Head: Normocephalic and atraumatic.     Mouth/Throat:     Mouth: Mucous membranes are moist.  Eyes:     Conjunctiva/sclera: Conjunctivae normal.     Pupils: Pupils are equal, round, and reactive to light.  Cardiovascular:     Rate and Rhythm: Normal rate and regular rhythm.     Pulses: Normal pulses.     Heart sounds: Normal heart sounds.  Pulmonary:     Effort: Pulmonary effort is normal.     Breath sounds: Normal breath sounds.  Abdominal:     Palpations: Abdomen is soft.     Tenderness: There is no abdominal tenderness.  Musculoskeletal:     Comments: Splint present on right lower extremity. Patient is able to wiggle her toes.  Capillary refill is  within 2 seconds.  Skin:    General: Skin is warm and dry.     Capillary Refill: Capillary refill takes less than 2 seconds.     Findings: No rash.  Neurological:     General: No focal deficit present.     Mental Status: She is alert.     Sensory: No sensory deficit.  Psychiatric:        Mood and Affect: Mood normal.        Behavior: Behavior normal.    ED Results / Procedures / Treatments   Labs (all labs ordered are listed, but only abnormal results are displayed) Labs Reviewed - No data to display  EKG None  Radiology DG Ankle 2 Views Right  Result Date: 04/24/2023 CLINICAL DATA:  Trimalleolar fracture.  Postreduction. EXAM: RIGHT ANKLE - 2 VIEW COMPARISON:  Radiographs same date. FINDINGS: Interval closed reduction and splinting of the previously demonstrated trimalleolar fracture for the overall alignment has improved, with less widening of the ankle mortise. There is persistent posterior dislocation of the talar dome relative to the tibial plafond. No new fractures are identified. IMPRESSION: Improved alignment of the trimalleolar fracture dislocation post closed reduction and splinting. Persistent posterior tibiotalar dislocation. Electronically Signed   By: Carey Bullocks M.D.   On: 04/24/2023 16:09   DG Ankle Complete Right  Result Date: 04/24/2023 CLINICAL DATA:  Severe ankle pain and deformity after slipping and falling. EXAM: RIGHT ANKLE - COMPLETE 3+ VIEW COMPARISON:  None Available. FINDINGS: There is a trimalleolar fracture dislocation with posterior and lateral dislocation of the talus relative to the tibial plafond. Oblique fracture of the distal fibular diaphysis demonstrates moderate lateral, posterior and proximal displacement. There is a posteriorly displaced fracture of the posterior malleolus. The medial malleolus is laterally displaced. There is widening of the ankle mortise. The talar dome appears intact, and no tarsal bone fractures are identified. No evidence  of foreign body or soft tissue emphysema. IMPRESSION: Trimalleolar fracture dislocation of the right ankle as described. Electronically Signed   By: Carey Bullocks M.D.   On: 04/24/2023 16:08    Procedures Procedures: not indicated.   Medications Ordered in ED Medications  HYDROmorphone (DILAUDID) injection 1 mg (1 mg Intravenous Given 04/25/23 1632)  ondansetron (ZOFRAN) injection 4 mg (4 mg Intravenous Given 04/25/23 1631)    ED Course/ Medical Decision Making/ A&P                                 Medical Decision Making Amount and/or Complexity of Data Reviewed Radiology:  ordered.  Risk Prescription drug management.   This patient presents to the ED for concern of persistent right ankle pain, this involves an extensive number of treatment options, and is a complaint that carries with it a high risk of complications and morbidity.   Differential diagnosis includes: compartment syndrome, non-displaced vs displaced fracture, etc.   Comorbidities  See HPI above   Additional History  Additional history obtained from previous records.   Imaging Studies  I ordered imaging studies including right ankle x-ray x2 I independently visualized and interpreted imaging which showed: Tri malleoli are fracture dislocation of the right ankle. Postreduction x-ray shows trimalleolar fractures with stable alignment and mild persistent posterior tibiotalar dislocation. I agree with the radiologist interpretation. CT right ankle without contrast ordered by orthopedics.  Showed no acute stress fracture.   Consultations  I requested consultation with orthopedics,  and discussed lab and imaging findings as well as pertinent plan - they: Given to the ED to her with reduction.  They ordered a CT which showed no signs of strokes fracture.  Patient instructed to follow-up with Ortho as scheduled.   Problem List / ED Course / Critical Interventions / Medication Management  Persistent right  ankle pain I ordered medications including: Dilaudid and Zofran for pain Propofol for sedation Reevaluation of the patient after these medicines showed that the patient improved I have reviewed the patients home medicines and have made adjustments as needed   Social Determinants of Health  Housing   Test / Admission - Considered  Discussed findings with patient. Prescription for pain medication sent to the pharmacy. Return precautions provided.       Final Clinical Impression(s) / ED Diagnoses Final diagnoses:  None    Rx / DC Orders ED Discharge Orders     None         Maxwell Marion, PA-C 04/25/23 2354    Linwood Dibbles, MD 04/26/23 [id number]

## 2023-04-25 NOTE — Sedation Documentation (Signed)
Pt is alert and oriented talking with family

## 2023-04-25 NOTE — ED Triage Notes (Signed)
Pt came in via POV d/t falling & breaking her foot in 4 places yesterday plus dislocated it & being brought to [facility] when it happened. She does have it wrapped in ace bandage & is needing to see an orthopedic since [facility] did not have one on site the day she was brought in d/t the injury. A/OX4, rates pain 8/10.

## 2023-04-25 NOTE — ED Provider Notes (Signed)
Patient with persistent ankle pain after recent injury.  Plan was for repeat reduction in the ED.  Dr Roda Shutters is at the bedside. Physical Exam  BP (!) 101/57 (BP Location: Right Arm)   Pulse 66   Temp 98.1 F (36.7 C) (Oral)   Resp 18   Ht 1.524 m (5')   Wt 68 kg   LMP 05/22/2017   SpO2 98%   BMI 29.29 kg/m   Physical Exam  Procedures  .Sedation  Date/Time: 04/25/2023 8:01 PM  Performed by: Linwood Dibbles, MD Authorized by: Linwood Dibbles, MD   Consent:    Consent obtained:  Written   Consent given by:  Patient   Risks discussed:  Allergic reaction, dysrhythmia, vomiting, inadequate sedation, nausea, respiratory compromise necessitating ventilatory assistance and intubation, prolonged sedation necessitating reversal and prolonged hypoxia resulting in organ damage Universal protocol:    Immediately prior to procedure, a time out was called: yes   Indications:    Procedure performed:  Fracture reduction   Procedure necessitating sedation performed by:  Different physician Pre-sedation assessment:    Time since last food or drink:  5   ASA classification: class 1 - normal, healthy patient     Mouth opening:  2 finger widths   Thyromental distance:  3 finger widths   Mallampati score:  II - soft palate, uvula, fauces visible   Neck mobility: normal     Pre-sedation assessments completed and reviewed: airway patency, cardiovascular function, mental status, nausea/vomiting, pain level, respiratory function and temperature     Pre-sedation assessment completed:  04/25/2023 7:15 PM Immediate pre-procedure details:    Reassessment: Patient reassessed immediately prior to procedure     Reviewed: vital signs     Verified: bag valve mask available, emergency equipment available, intubation equipment available, IV patency confirmed, oxygen available, reversal medications available and suction available   Procedure details (see MAR for exact dosages):    Preoxygenation:  Nasal cannula   Sedation:   Propofol   Intended level of sedation: deep   Intra-procedure monitoring:  Blood pressure monitoring, cardiac monitor, continuous pulse oximetry, continuous capnometry, frequent LOC assessments and frequent vital sign checks   Intra-procedure events: none     Total Provider sedation time (minutes):  20 Post-procedure details:    Post-sedation assessment completed:  04/25/2023 8:02 PM   Post-sedation assessments completed and reviewed: airway patency, cardiovascular function, hydration status, mental status, nausea/vomiting, pain level and respiratory function     Patient is stable for discharge or admission: yes     Procedure completion:  Tolerated well, no immediate complications   ED Course / MDM    Medical Decision Making Amount and/or Complexity of Data Reviewed Radiology: ordered.  Risk Prescription drug management.  I provided a substantive portion of the care of this patient.  I personally made/approved the management plan for this patient and take responsibility for the patient management.          Linwood Dibbles, MD 04/25/23 2003

## 2023-04-28 ENCOUNTER — Ambulatory Visit: Payer: Medicaid Other | Admitting: Orthopedic Surgery

## 2023-04-28 ENCOUNTER — Telehealth: Payer: Self-pay | Admitting: Orthopedic Surgery

## 2023-04-28 ENCOUNTER — Encounter: Payer: Self-pay | Admitting: Orthopedic Surgery

## 2023-04-28 DIAGNOSIS — S82851A Displaced trimalleolar fracture of right lower leg, initial encounter for closed fracture: Secondary | ICD-10-CM

## 2023-04-28 MED ORDER — OXYCODONE HCL 5 MG PO TABS
5.0000 mg | ORAL_TABLET | ORAL | 0 refills | Status: DC | PRN
Start: 2023-04-28 — End: 2023-04-28

## 2023-04-28 MED ORDER — OXYCODONE HCL 5 MG PO TABS
5.0000 mg | ORAL_TABLET | ORAL | 0 refills | Status: DC | PRN
Start: 2023-04-28 — End: 2023-05-02

## 2023-04-28 NOTE — Telephone Encounter (Signed)
Dr. Dallas Schimke pt - pt was just seen and her fiance came back and stated the medication was sent to [facility] in [place] and it needs to go to [facility] in [place].  They would like this changed.

## 2023-04-28 NOTE — Patient Instructions (Signed)
Your surgery will be at [facility], scheduled with Dr Thane Edu   The hospital will contact you with a preoperative appointment to discuss Anesthesia. The phone number is [phone]   Please bring your medications with you for the appointment.  They will tell you the arrival time and medication instructions when you have your preoperative evaluation.  Do not wear nail polish the day of your surgery and if you take Phentermine you need to stop this medication ONE WEEK prior to your surgery.    If you take an blood thinning medication, we will need to stop this prior to surgery.  Typically, we stop this medicine at least 5 days prior to surgery.  We will need to confirm this with the doctor who prescribes this medication.  If you are taking medications or an injection for diabetes, or for weight management, this medicine will need to be stopped at least 7 days prior to surgery.     Surgery will be scheduled for 10/7

## 2023-04-28 NOTE — Progress Notes (Signed)
New Patient Visit  Assessment: Debbie Delgado is a 52 y.o. female with the following: 1. Closed displaced trimalleolar fracture of right ankle, initial encounter  Plan: [name] sustained a right trimalleolar ankle fracture dislocation.  It required multiple reduction attempts.  Currently, it is reduced, and in a well-padded splint.  She continues to have pain.  I provided her with updated pain medicine.  Continue to elevate.  Given the nature of her injury, I have recommended operative fixation.  She is to elevate the leg to help with swelling.  She is aware that if it is too swollen at the time of surgery next week, we will have to defer to a later time point.  I outlined the plan for surgery, and the anticipated recovery.  I am happy to complete any paperwork that she needs for work.  Risks and benefits of surgery, including, but not limited to infection, bleeding, persistent pain, damage to surrounding structures, need for further surgery, malunion, nonunion and more severe complications associated with anesthesia were discussed.  All questions have been answered and she has elected to proceed with surgery.    Follow-up: Return for After surgery; DOS 10/7.  Subjective:  Chief Complaint  Patient presents with   Fracture    R ankle DOI 04/24/23    History of Present Illness: Debbie Delgado is a 52 y.o. female who presents for evaluation of right ankle pain.  During the rainy weather last week, she was wearing sandals, and she slipped and fell.  She had immediate pain and obvious deformity of the right ankle.  She was evaluated in the emergency department.  Reduction improved alignment, without full resolution of her injury.  We made plans to proceed with closed reduction attempt, and possible fixation earlier this week.  However, over the weekend, she continued to have significant pain.  She was evaluated at the ED in [place], and the ankle has been reduced.  CT scan has also  been obtained.  She continues to have pain.  No numbness or tingling.  She is taking pain medications on a regular basis.   Review of Systems: No fevers or chills No numbness or tingling No chest pain No shortness of breath No bowel or bladder dysfunction No GI distress No headaches   Medical History:  Past Medical History:  Diagnosis Date   Asthma    Cancer (HCC)    cervical   Complication of anesthesia    "hard to wake"   Ectopic pregnancy    Endometriosis    Fibroid     Past Surgical History:  Procedure Laterality Date   BREAST BIOPSY Right 12/08/2022   MM RT BREAST BX W LOC DEV 1ST LESION IMAGE BX SPEC STEREO GUIDE 12/08/2022 [facility]   BREAST BIOPSY  02/16/2023   MM RT RADIOACTIVE SEED LOC MAMMO GUIDE 02/16/2023 [facility]   BREAST LUMPECTOMY WITH RADIOACTIVE SEED LOCALIZATION Right 02/17/2023   Procedure: RIGHT BREAST LUMPECTOMY WITH RADIOACTIVE SEED LOCALIZATION;  Surgeon: Harriette Bouillon, MD;  Location: [facility];  Service: General;  Laterality: Right;   CARPAL TUNNEL RELEASE     TONSILLECTOMY      Family History  Problem Relation Age of Onset   Allergic rhinitis Sister    Breast cancer Maternal Grandmother    Allergic rhinitis Brother    Angioedema Neg Hx    Asthma Neg Hx    Atopy Neg Hx    Eczema Neg Hx    Immunodeficiency Neg  Hx    Urticaria Neg Hx    Social History   Tobacco Use   Smoking status: Every Day   Smokeless tobacco: Never   Tobacco comments:    1/2 PPD  Substance Use Topics   Alcohol use: Yes    Comment: occasionally   Drug use: No    Allergies  Allergen Reactions   Bupropion Other (See Comments)    Severe dry mouth    Fluoxetine Other (See Comments)    Hot flashes     Venlafaxine Hcl Other (See Comments)    Hot flashes    Latex Hives   Tylenol [Acetaminophen] Nausea And Vomiting    "I can't take tylenol" NVD   Codeine Itching and Nausea And Vomiting    Current Meds  Medication Sig    azelastine (ASTELIN) 0.1 % nasal spray Place 2 sprays into both nostrils 2 (two) times daily. Use in each nostril as directed (Patient taking differently: Place 2 sprays into both nostrils daily as needed for allergies or rhinitis. Use in each nostril as directed)   cycloSPORINE (RESTASIS) 0.05 % ophthalmic emulsion Place 1 drop into both eyes 2 (two) times daily.   EPINEPHrine (EPIPEN 2-PAK) 0.3 mg/0.3 mL IJ SOAJ injection Inject 0.3 mg into the muscle as needed for anaphylaxis.   ibuprofen (ADVIL) 600 MG tablet Take 1 tablet (600 mg total) by mouth every 6 (six) hours as needed.   oxyCODONE (ROXICODONE) 5 MG immediate release tablet Take 1 tablet (5 mg total) by mouth every 4 (four) hours as needed for up to 7 days.   [DISCONTINUED] oxyCODONE (ROXICODONE) 5 MG immediate release tablet Take 1 tablet (5 mg total) by mouth every 4 (four) hours as needed for up to 7 days.    Objective: LMP 05/22/2017   Physical Exam:  General: Alert and oriented., No acute distress., and Seated in a wheelchair. Gait: Unable to ambulate.  Evaluation of the right ankle demonstrates a splint that is clean, dry and intact.  Toes are warm well-perfused.  There is some swelling to the dorsum of the foot.  Foot is soft and compressible.  Anterior compartment is soft and compressible.  Sensation intact to the toes.  IMAGING: I personally reviewed images previously obtained from the ED  X-rays and CT scan of the right ankle both pre and postreduction attempts were evaluated.  She has a trimalleolar ankle fracture, with distal fibula, small medial malleolus fracture fragment.  In addition, she has a posterior malleolus fracture fragment, which is minimally displaced, and represents 20% or less of the articular surface.   New Medications:  Meds ordered this encounter  Medications   DISCONTD: oxyCODONE (ROXICODONE) 5 MG immediate release tablet    Sig: Take 1 tablet (5 mg total) by mouth every 4 (four) hours as  needed for up to 7 days.    Dispense:  30 tablet    Refill:  0   oxyCODONE (ROXICODONE) 5 MG immediate release tablet    Sig: Take 1 tablet (5 mg total) by mouth every 4 (four) hours as needed for up to 7 days.    Dispense:  30 tablet    Refill:  0      Oliver Barre, MD  04/28/2023 3:45 PM

## 2023-04-29 NOTE — ED Notes (Signed)
consent for conscious sedation signed by pt and verbal consent given by son due to pt already having narcotics on board

## 2023-04-29 NOTE — ED Notes (Signed)
Pt a/o and calm at d/c.

## 2023-04-30 ENCOUNTER — Other Ambulatory Visit (HOSPITAL_COMMUNITY): Payer: Medicaid Other

## 2023-04-30 ENCOUNTER — Ambulatory Visit: Payer: Medicaid Other | Admitting: Orthopaedic Surgery

## 2023-04-30 ENCOUNTER — Encounter (HOSPITAL_BASED_OUTPATIENT_CLINIC_OR_DEPARTMENT_OTHER): Payer: Self-pay | Admitting: Orthopaedic Surgery

## 2023-04-30 DIAGNOSIS — S82851A Displaced trimalleolar fracture of right lower leg, initial encounter for closed fracture: Secondary | ICD-10-CM

## 2023-04-30 NOTE — Progress Notes (Signed)
Office Visit Note   Patient: Debbie Delgado           Date of Birth: [birth year]           MRN: [id number] Visit Date: 04/30/2023              Requested by: Orlene Plum, NP [address] [place],  [place] [address] PCP: Orlene Plum, NP   Assessment & Plan: Visit Diagnoses:  1. Closed displaced trimalleolar fracture of right ankle, initial encounter     Plan: Jamariya is a 52 year old female with a right trimalar ankle fracture dislocation.  We will place her back into the splint and she will keep this elevated at all times.  She has been instructed to take a baby aspirin once a day.  We will delay the surgery until next week to allow the swelling to dissipate so we can safely create surgical incisions.  She will call Debbie Delgado if she needs a refill on pain medications.  Details of the surgery including risk benefits prognosis reviewed.  Debbie met with the patient today.  Total face to face encounter time was greater than 25 minutes and over half of this time was spent in counseling and/or coordination of care.  Follow-Up Instructions: No follow-ups on file.   Orders:  No orders of the defined types were placed in this encounter.  No orders of the defined types were placed in this encounter.     Procedures: No procedures performed   Clinical Data: No additional findings.   Subjective: Chief Complaint  Patient presents with   Right Ankle - Pain    HPI Debbie Delgado is following up from the ER from last weekend.  She had a right trimalleolar ankle fracture dislocation that I reduced.  She decided to continue her care with me in [place] instead of in [place].  Review of Systems  Constitutional: Negative.   HENT: Negative.    Eyes: Negative.   Respiratory: Negative.    Cardiovascular: Negative.   Endocrine: Negative.   Musculoskeletal: Negative.   Neurological: Negative.   Hematological: Negative.   Psychiatric/Behavioral: Negative.    All other systems  reviewed and are negative.    Objective: Vital Signs: LMP 05/22/2017   Physical Exam Vitals and nursing note reviewed.  Constitutional:      Appearance: She is well-developed.  HENT:     Head: Normocephalic and atraumatic.  Pulmonary:     Effort: Pulmonary effort is normal.  Abdominal:     Palpations: Abdomen is soft.  Musculoskeletal:     Cervical back: Neck supple.  Skin:    General: Skin is warm.     Capillary Refill: Capillary refill takes less than 2 seconds.  Neurological:     Mental Status: She is alert and oriented to person, place, and time.  Psychiatric:        Behavior: Behavior normal.        Thought Content: Thought content normal.        Judgment: Judgment normal.     Ortho Exam Exam of the right ankle shows moderate swelling.  She has a very small clear fracture blister on the lateral aspect of the ankle.  No neurovascular compromise. Specialty Comments:  No specialty comments available.  Imaging: No results found.   PMFS History: Patient Active Problem List   Diagnosis Date Noted   Closed displaced trimalleolar fracture of right lower leg 04/25/2023   Seasonal and perennial allergic rhinitis 12/22/2019   Anemia 06/05/2017  Abnormal uterine bleeding (AUB) 06/05/2017   Past Medical History:  Diagnosis Date   Asthma    Cancer (HCC)    cervical   Complication of anesthesia    "hard to wake"   Ectopic pregnancy    Endometriosis    Fibroid     Family History  Problem Relation Age of Onset   Allergic rhinitis Sister    Breast cancer Maternal Grandmother    Allergic rhinitis Brother    Angioedema Neg Hx    Asthma Neg Hx    Atopy Neg Hx    Eczema Neg Hx    Immunodeficiency Neg Hx    Urticaria Neg Hx     Past Surgical History:  Procedure Laterality Date   BREAST BIOPSY Right 12/08/2022   MM RT BREAST BX W LOC DEV 1ST LESION IMAGE BX SPEC STEREO GUIDE 12/08/2022 [facility]   BREAST BIOPSY  02/16/2023   MM RT RADIOACTIVE SEED LOC  MAMMO GUIDE 02/16/2023 [facility]   BREAST LUMPECTOMY WITH RADIOACTIVE SEED LOCALIZATION Right 02/17/2023   Procedure: RIGHT BREAST LUMPECTOMY WITH RADIOACTIVE SEED LOCALIZATION;  Surgeon: Harriette Bouillon, MD;  Location: [facility];  Service: General;  Laterality: Right;   CARPAL TUNNEL RELEASE     TONSILLECTOMY     Social History   Occupational History   Not on file  Tobacco Use   Smoking status: Every Day   Smokeless tobacco: Never   Tobacco comments:    1/2 PPD  Substance and Sexual Activity   Alcohol use: Yes    Comment: occasionally   Drug use: No   Sexual activity: Not on file

## 2023-05-02 ENCOUNTER — Other Ambulatory Visit: Payer: Self-pay | Admitting: Orthopaedic Surgery

## 2023-05-02 ENCOUNTER — Other Ambulatory Visit: Payer: Self-pay | Admitting: Orthopedic Surgery

## 2023-05-02 MED ORDER — OXYCODONE HCL 5 MG PO TABS: 5.0000 mg | ORAL_TABLET | ORAL | 0 refills | Status: DC | PRN

## 2023-05-04 ENCOUNTER — Other Ambulatory Visit: Payer: Self-pay | Admitting: Physician Assistant

## 2023-05-04 ENCOUNTER — Encounter (HOSPITAL_COMMUNITY): Admission: RE | Payer: Self-pay | Source: Home / Self Care

## 2023-05-04 ENCOUNTER — Ambulatory Visit (HOSPITAL_COMMUNITY): Admission: RE | Admit: 2023-05-04 | Payer: Medicaid Other | Source: Home / Self Care | Admitting: Orthopedic Surgery

## 2023-05-04 DIAGNOSIS — S82851A Displaced trimalleolar fracture of right lower leg, initial encounter for closed fracture: Secondary | ICD-10-CM

## 2023-05-04 DIAGNOSIS — Z01818 Encounter for other preprocedural examination: Secondary | ICD-10-CM

## 2023-05-04 SURGERY — OPEN REDUCTION INTERNAL FIXATION (ORIF) ANKLE FRACTURE
Anesthesia: General | Site: Ankle | Laterality: Right

## 2023-05-04 MED ORDER — ONDANSETRON HCL 4 MG PO TABS: 4.0000 mg | ORAL_TABLET | Freq: Three times a day (TID) | ORAL | 0 refills | Status: AC | PRN

## 2023-05-06 ENCOUNTER — Other Ambulatory Visit: Payer: Self-pay | Admitting: Physician Assistant

## 2023-05-06 ENCOUNTER — Ambulatory Visit (HOSPITAL_COMMUNITY)
Admission: RE | Admit: 2023-05-06 | Discharge: 2023-05-06 | Disposition: A | Discharge status: Home / Self Care | Payer: Medicaid Other | Attending: Orthopaedic Surgery | Admitting: Orthopaedic Surgery

## 2023-05-06 ENCOUNTER — Encounter (HOSPITAL_BASED_OUTPATIENT_CLINIC_OR_DEPARTMENT_OTHER)
Admission: RE | Disposition: A | Discharge status: Home / Self Care | Payer: Self-pay | Source: Home / Self Care | Attending: Orthopaedic Surgery

## 2023-05-06 ENCOUNTER — Ambulatory Visit (HOSPITAL_BASED_OUTPATIENT_CLINIC_OR_DEPARTMENT_OTHER): Payer: Medicaid Other | Admitting: Anesthesiology

## 2023-05-06 ENCOUNTER — Ambulatory Visit (HOSPITAL_COMMUNITY): Payer: Medicaid Other

## 2023-05-06 ENCOUNTER — Other Ambulatory Visit: Payer: Self-pay

## 2023-05-06 ENCOUNTER — Encounter (HOSPITAL_BASED_OUTPATIENT_CLINIC_OR_DEPARTMENT_OTHER): Payer: Self-pay | Admitting: Orthopaedic Surgery

## 2023-05-06 DIAGNOSIS — X58XXXA Exposure to other specified factors, initial encounter: Secondary | ICD-10-CM | POA: Insufficient documentation

## 2023-05-06 DIAGNOSIS — S82851A Displaced trimalleolar fracture of right lower leg, initial encounter for closed fracture: Secondary | ICD-10-CM | POA: Diagnosis present

## 2023-05-06 DIAGNOSIS — F17210 Nicotine dependence, cigarettes, uncomplicated: Secondary | ICD-10-CM | POA: Insufficient documentation

## 2023-05-06 DIAGNOSIS — Z01818 Encounter for other preprocedural examination: Secondary | ICD-10-CM

## 2023-05-06 HISTORY — PX: ORIF ANKLE FRACTURE: SHX5408

## 2023-05-06 SURGERY — OPEN REDUCTION INTERNAL FIXATION (ORIF) ANKLE FRACTURE
Anesthesia: Regional | Site: Ankle | Laterality: Right

## 2023-05-06 MED ORDER — DEXMEDETOMIDINE HCL IN NACL 80 MCG/20ML IV SOLN
INTRAVENOUS | Status: DC | PRN
Start: 2023-05-06 — End: 2023-05-06
  Administered 2023-05-06: 8 ug via INTRAVENOUS
  Administered 2023-05-06: 4 ug via INTRAVENOUS
  Administered 2023-05-06: 8 ug via INTRAVENOUS

## 2023-05-06 MED ORDER — FENTANYL CITRATE (PF) 100 MCG/2ML IJ SOLN
INTRAMUSCULAR | Status: AC
  Filled 2023-05-06: qty 2

## 2023-05-06 MED ORDER — BUPIVACAINE HCL (PF) 0.5 % IJ SOLN
INTRAMUSCULAR | Status: DC | PRN
Start: 2023-05-06 — End: 2023-05-06
  Administered 2023-05-06: 20 mL via PERINEURAL

## 2023-05-06 MED ORDER — OXYCODONE HCL 5 MG PO TABS
5.0000 mg | ORAL_TABLET | Freq: Once | ORAL | Status: AC
  Administered 2023-05-06: 5 mg via ORAL

## 2023-05-06 MED ORDER — PROPOFOL 10 MG/ML IV BOLUS
INTRAVENOUS | Status: DC | PRN
  Administered 2023-05-06: 200 mg via INTRAVENOUS
  Administered 2023-05-06: 100 mg via INTRAVENOUS

## 2023-05-06 MED ORDER — ONDANSETRON HCL 4 MG/2ML IJ SOLN
INTRAMUSCULAR | Status: DC | PRN
  Administered 2023-05-06: 4 mg via INTRAVENOUS

## 2023-05-06 MED ORDER — LACTATED RINGERS IV SOLN: INTRAVENOUS | Status: DC

## 2023-05-06 MED ORDER — ASPIRIN 81 MG PO TBEC
81.0000 mg | DELAYED_RELEASE_TABLET | Freq: Two times a day (BID) | ORAL | 0 refills | Status: DC
Start: 2023-05-06 — End: 2023-05-20

## 2023-05-06 MED ORDER — SODIUM CHLORIDE 0.9 % IV SOLN
INTRAVENOUS | Status: DC | PRN
Start: 2023-05-06 — End: 2023-05-06

## 2023-05-06 MED ORDER — KETAMINE HCL 50 MG/5ML IJ SOSY
PREFILLED_SYRINGE | INTRAMUSCULAR | Status: AC
  Filled 2023-05-06: qty 5

## 2023-05-06 MED ORDER — DEXAMETHASONE SODIUM PHOSPHATE 10 MG/ML IJ SOLN
INTRAMUSCULAR | Status: DC | PRN
Start: 2023-05-06 — End: 2023-05-06
  Administered 2023-05-06: 10 mg

## 2023-05-06 MED ORDER — EPHEDRINE SULFATE (PRESSORS) 50 MG/ML IJ SOLN
INTRAMUSCULAR | Status: DC | PRN
Start: 2023-05-06 — End: 2023-05-06
  Administered 2023-05-06 (×2): 10 mg via INTRAVENOUS
  Administered 2023-05-06: 5 mg via INTRAVENOUS

## 2023-05-06 MED ORDER — OXYCODONE-ACETAMINOPHEN 5-325 MG PO TABS
1.0000 | ORAL_TABLET | Freq: Two times a day (BID) | ORAL | 0 refills | Status: DC | PRN
Start: 2023-05-06 — End: 2023-05-09

## 2023-05-06 MED ORDER — PROPOFOL 10 MG/ML IV BOLUS
INTRAVENOUS | Status: AC
  Filled 2023-05-06: qty 20

## 2023-05-06 MED ORDER — KETAMINE HCL 50 MG/ML IJ SOLN
INTRAMUSCULAR | Status: DC | PRN
Start: 2023-05-06 — End: 2023-05-06
  Administered 2023-05-06: 10 mg via INTRAMUSCULAR
  Administered 2023-05-06: 20 mg via INTRAMUSCULAR

## 2023-05-06 MED ORDER — OXYCODONE HCL 5 MG PO TABS
ORAL_TABLET | ORAL | Status: AC
  Filled 2023-05-06: qty 1

## 2023-05-06 MED ORDER — FENTANYL CITRATE (PF) 100 MCG/2ML IJ SOLN
25.0000 ug | INTRAMUSCULAR | Status: DC | PRN
  Administered 2023-05-06 (×2): 25 ug via INTRAVENOUS

## 2023-05-06 MED ORDER — CEFAZOLIN SODIUM-DEXTROSE 2-4 GM/100ML-% IV SOLN
INTRAVENOUS | Status: AC
  Filled 2023-05-06: qty 100

## 2023-05-06 MED ORDER — MIDAZOLAM HCL 2 MG/2ML IJ SOLN
2.0000 mg | Freq: Once | INTRAMUSCULAR | Status: AC
  Administered 2023-05-06: 2 mg via INTRAVENOUS

## 2023-05-06 MED ORDER — LIDOCAINE HCL (CARDIAC) PF 100 MG/5ML IV SOSY
PREFILLED_SYRINGE | INTRAVENOUS | Status: DC | PRN
  Administered 2023-05-06: 40 mg via INTRAVENOUS

## 2023-05-06 MED ORDER — CEFAZOLIN SODIUM-DEXTROSE 2-4 GM/100ML-% IV SOLN
2.0000 g | INTRAVENOUS | Status: AC
  Administered 2023-05-06: 2 g via INTRAVENOUS

## 2023-05-06 MED ORDER — ROPIVACAINE HCL 5 MG/ML IJ SOLN
INTRAMUSCULAR | Status: DC | PRN
Start: 2023-05-06 — End: 2023-05-06
  Administered 2023-05-06: 20 mL via PERINEURAL

## 2023-05-06 MED ORDER — DEXAMETHASONE SODIUM PHOSPHATE 10 MG/ML IJ SOLN
INTRAMUSCULAR | Status: DC | PRN
  Administered 2023-05-06: 10 mg via INTRAVENOUS

## 2023-05-06 MED ORDER — FENTANYL CITRATE (PF) 100 MCG/2ML IJ SOLN
100.0000 ug | Freq: Once | INTRAMUSCULAR | Status: AC
  Administered 2023-05-06: 100 ug via INTRAVENOUS

## 2023-05-06 MED ORDER — FENTANYL CITRATE (PF) 100 MCG/2ML IJ SOLN
INTRAMUSCULAR | Status: DC | PRN
  Administered 2023-05-06: 50 ug via INTRAVENOUS

## 2023-05-06 MED ORDER — BUPIVACAINE LIPOSOME 1.3 % IJ SUSP
INTRAMUSCULAR | Status: DC | PRN
Start: 2023-05-06 — End: 2023-05-06
  Administered 2023-05-06: 10 mL via PERINEURAL

## 2023-05-06 MED ORDER — 0.9 % SODIUM CHLORIDE (POUR BTL) OPTIME
TOPICAL | Status: DC | PRN
Start: 2023-05-06 — End: 2023-05-06
  Administered 2023-05-06: 3000 mL

## 2023-05-06 MED ORDER — OXYCODONE HCL 5 MG PO TABS: 5.0000 mg | ORAL_TABLET | Freq: Four times a day (QID) | ORAL | 0 refills | Status: DC | PRN

## 2023-05-06 MED ORDER — MIDAZOLAM HCL 2 MG/2ML IJ SOLN
INTRAMUSCULAR | Status: AC
  Filled 2023-05-06: qty 2

## 2023-05-06 SURGICAL SUPPLY — 84 items
BANDAGE ESMARK 6X9 LF (GAUZE/BANDAGES/DRESSINGS) IMPLANT
BIT DRILL SHORT 2.0 ZI (BIT) IMPLANT
BIT DRILL SHORT 2.5 (BIT) IMPLANT
BIT DRL SHORT 2.5 (BIT) ×1
BLADE SURG 15 STRL LF DISP TIS (BLADE) ×2 IMPLANT
BLADE SURG 15 STRL SS (BLADE) ×2
BNDG CMPR 5X4 KNIT ELC UNQ LF (GAUZE/BANDAGES/DRESSINGS) ×2
BNDG CMPR 6 X 5 YARDS HK CLSR (GAUZE/BANDAGES/DRESSINGS) ×1
BNDG CMPR 9X6 STRL LF SNTH (GAUZE/BANDAGES/DRESSINGS) ×1
BNDG ELASTIC 4INX 5YD STR LF (GAUZE/BANDAGES/DRESSINGS) ×1 IMPLANT
BNDG ELASTIC 6INX 5YD STR LF (GAUZE/BANDAGES/DRESSINGS) ×1 IMPLANT
BNDG ESMARK 6X9 LF (GAUZE/BANDAGES/DRESSINGS) ×1
BRUSH SCRUB EZ PLAIN DRY (MISCELLANEOUS) ×1 IMPLANT
CANISTER SUCT 1200ML W/VALVE (MISCELLANEOUS) ×1 IMPLANT
COVER BACK TABLE 60X90IN (DRAPES) ×1 IMPLANT
CUFF TOURN SGL QUICK 34 (TOURNIQUET CUFF) ×1
CUFF TRNQT CYL 34X4.125X (TOURNIQUET CUFF) IMPLANT
DRAPE C-ARM 42X72 X-RAY (DRAPES) ×1 IMPLANT
DRAPE C-ARMOR (DRAPES) ×1 IMPLANT
DRAPE EXTREMITY T 121X128X90 (DISPOSABLE) ×1 IMPLANT
DRAPE IMP U-DRAPE 54X76 (DRAPES) ×1 IMPLANT
DRAPE INCISE IOBAN 66X45 STRL (DRAPES) IMPLANT
DRAPE U-SHAPE 47X51 STRL (DRAPES) ×1 IMPLANT
DURAPREP 26ML APPLICATOR (WOUND CARE) ×2 IMPLANT
ELECT REM PT RETURN 9FT ADLT (ELECTROSURGICAL) ×1
ELECTRODE REM PT RTRN 9FT ADLT (ELECTROSURGICAL) ×1 IMPLANT
GAUZE PAD ABD 8X10 STRL (GAUZE/BANDAGES/DRESSINGS) ×5 IMPLANT
GAUZE SPONGE 4X4 12PLY STRL (GAUZE/BANDAGES/DRESSINGS) ×1 IMPLANT
GAUZE XEROFORM 1X8 LF (GAUZE/BANDAGES/DRESSINGS) ×1 IMPLANT
GLOVE BIOGEL PI IND STRL 6.5 (GLOVE) IMPLANT
GLOVE BIOGEL PI IND STRL 7.5 (GLOVE) ×1 IMPLANT
GLOVE ECLIPSE 7.0 STRL STRAW (GLOVE) ×2 IMPLANT
GLOVE INDICATOR 7.0 STRL GRN (GLOVE) ×1 IMPLANT
GLOVE SURG SS PI 6.5 STRL IVOR (GLOVE) IMPLANT
GLOVE SURG SS PI 7.0 STRL IVOR (GLOVE) IMPLANT
GLOVE SURG SS PI 7.5 STRL IVOR (GLOVE) ×1 IMPLANT
GLOVE SURG SYN 7.5 E (GLOVE) ×2
GLOVE SURG SYN 7.5 PF PI (GLOVE) ×1 IMPLANT
GOWN STRL REUS W/ TWL LRG LVL3 (GOWN DISPOSABLE) ×1 IMPLANT
GOWN STRL REUS W/TWL LRG LVL3 (GOWN DISPOSABLE) ×1
GOWN STRL SURGICAL XL XLNG (GOWN DISPOSABLE) ×1 IMPLANT
K-WIRE OLIVE PLT TACK 2.0-4 SU (WIRE) ×2
KWIRE OLIVE PLT TACK 2.0-4 SU (WIRE) IMPLANT
MANIFOLD NEPTUNE II (INSTRUMENTS) ×1 IMPLANT
NDL HYPO 22X1.5 SAFETY MO (MISCELLANEOUS) IMPLANT
NEEDLE HYPO 22X1.5 SAFETY MO (MISCELLANEOUS)
NS IRRIG 1000ML POUR BTL (IV SOLUTION) ×1 IMPLANT
PACK BASIN DAY SURGERY FS (CUSTOM PROCEDURE TRAY) ×1 IMPLANT
PAD CAST 4YDX4 CTTN HI CHSV (CAST SUPPLIES) IMPLANT
PADDING CAST COTTON 4X4 STRL (CAST SUPPLIES) ×2
PADDING CAST COTTON 6X4 STRL (CAST SUPPLIES) IMPLANT
PADDING CAST SYNTHETIC 4X4 STR (CAST SUPPLIES) ×1 IMPLANT
PADDING CAST SYNTHETIC 6X4 NS (CAST SUPPLIES) ×1 IMPLANT
PENCIL SMOKE EVACUATOR (MISCELLANEOUS) ×1 IMPLANT
PLATE LAT FIB 6H RT (Plate) IMPLANT
PLATE MED MALL HOOK 3H (Plate) IMPLANT
SCREW LOCK MDS 2.7X14 (Screw) IMPLANT
SCREW LOCK MDS 2.7X28 (Screw) IMPLANT
SCREW LOCKING 2.7X24 (Screw) IMPLANT
SCREW NL 2.7X26 (Screw) IMPLANT
SCREW NL 4.0X14 (Screw) IMPLANT
SCREW NLOCK MVX 2.7X16 (Screw) IMPLANT
SCREW NON-LOCK 3.5X10 (Screw) IMPLANT
SHEET MEDIUM DRAPE 40X70 STRL (DRAPES) ×1 IMPLANT
SLEEVE SCD COMPRESS KNEE MED (STOCKING) ×1 IMPLANT
SPIKE FLUID TRANSFER (MISCELLANEOUS) IMPLANT
SPLINT FIBERGLASS 4X30 (CAST SUPPLIES) IMPLANT
SPONGE T-LAP 18X18 ~~LOC~~+RFID (SPONGE) ×1 IMPLANT
STOCKINETTE 6 STRL (DRAPES) ×1 IMPLANT
SUCTION TUBE FRAZIER 10FR DISP (SUCTIONS) ×1 IMPLANT
SUT ETHILON 3 0 PS 1 (SUTURE) ×1 IMPLANT
SUT VIC AB 0 CT1 27 (SUTURE) ×2
SUT VIC AB 0 CT1 27XBRD ANBCTR (SUTURE) ×1 IMPLANT
SUT VIC AB 2-0 CT1 27 (SUTURE) ×3
SUT VIC AB 2-0 CT1 TAPERPNT 27 (SUTURE) ×1 IMPLANT
SUT VIC AB 3-0 SH 27 (SUTURE)
SUT VIC AB 3-0 SH 27X BRD (SUTURE) IMPLANT
SYR BULB EAR ULCER 3OZ GRN STR (SYRINGE) IMPLANT
SYR CONTROL 10ML LL (SYRINGE) IMPLANT
TOWEL GREEN STERILE FF (TOWEL DISPOSABLE) ×1 IMPLANT
TRAY DSU PREP LF (CUSTOM PROCEDURE TRAY) ×1 IMPLANT
TUBE CONNECTING 20X1/4 (TUBING) ×1 IMPLANT
UNDERPAD 30X36 HEAVY ABSORB (UNDERPADS AND DIAPERS) ×1 IMPLANT
YANKAUER SUCT BULB TIP NO VENT (SUCTIONS) ×1 IMPLANT

## 2023-05-06 NOTE — Discharge Instructions (Addendum)
1. Keep splint clean and dry 2. Elevate foot above level of the heart 3. Take aspirin to prevent blood clots 4. Take pain meds as needed 5. Strict non weight bearing to operative extremity     Post Anesthesia Home Care Instructions  Activity: Get plenty of rest for the remainder of the day. A responsible individual must stay with you for 24 hours following the procedure.  For the next 24 hours, DO NOT: -Drive a car -Advertising copywriter -Drink alcoholic beverages -Take any medication unless instructed by your physician -Make any legal decisions or sign important papers.  Meals: Start with liquid foods such as gelatin or soup. Progress to regular foods as tolerated. Avoid greasy, spicy, heavy foods. If nausea and/or vomiting occur, drink only clear liquids until the nausea and/or vomiting subsides. Call your physician if vomiting continues.  Special Instructions/Symptoms: Your throat may feel dry or sore from the anesthesia or the breathing tube placed in your throat during surgery. If this causes discomfort, gargle with warm salt water. The discomfort should disappear within 24 hours.  If you had a scopolamine patch placed behind your ear for the management of post- operative nausea and/or vomiting:  1. The medication in the patch is effective for 72 hours, after which it should be removed.  Wrap patch in a tissue and discard in the trash. Wash hands thoroughly with soap and water. 2. You may remove the patch earlier than 72 hours if you experience unpleasant side effects which may include dry mouth, dizziness or visual disturbances. 3. Avoid touching the patch. Wash your hands with soap and water after contact with the patch.    Regional Anesthesia Blocks  1. You may not be able to move or feel the "blocked" extremity after a regional anesthetic block. This may last may last from 3-48 hours after placement, but it will go away. The length of time depends on the medication  injected and your individual response to the medication. As the nerves start to wake up, you may experience tingling as the movement and feeling returns to your extremity. If the numbness and inability to move your extremity has not gone away after 48 hours, please call your surgeon.   2. The extremity that is blocked will need to be protected until the numbness is gone and the strength has returned. Because you cannot feel it, you will need to take extra care to avoid injury. Because it may be weak, you may have difficulty moving it or using it. You may not know what position it is in without looking at it while the block is in effect.  3. For blocks in the legs and feet, returning to weight bearing and walking needs to be done carefully. You will need to wait until the numbness is entirely gone and the strength has returned. You should be able to move your leg and foot normally before you try and bear weight or walk. You will need someone to be with you when you first try to ensure you do not fall and possibly risk injury.  4. Bruising and tenderness at the needle site are common side effects and will resolve in a few days.  5. Persistent numbness or new problems with movement should be communicated to the surgeon or the [facility] [phone] [facility] [phone]).  Information for Discharge Teaching: EXPAREL (bupivacaine liposome injectable suspension)   Pain relief is important to your recovery. The goal is to control your  pain so you can move easier and return to your normal activities as soon as possible after your procedure. Your physician may use several types of medicines to manage pain, swelling, and more.  Your surgeon or anesthesiologist gave you EXPAREL(bupivacaine) to help control your pain after surgery.  EXPAREL is a local anesthetic designed to release slowly over an extended period of time to provide pain relief by numbing the tissue around the  surgical site. EXPAREL is designed to release pain medication over time and can control pain for up to 72 hours. Depending on how you respond to EXPAREL, you may require less pain medication during your recovery. EXPAREL can help reduce or eliminate the need for opioids during the first few days after surgery when pain relief is needed the most. EXPAREL is not an opioid and is not addictive. It does not cause sleepiness or sedation.   Important! A teal colored band has been placed on your arm with the date, time and amount of EXPAREL you have received. Please leave this armband in place for the full 96 hours following administration, and then you may remove the band. If you return to the hospital for any reason within 96 hours following the administration of EXPAREL, the armband provides important information that your health care providers to know, and alerts them that you have received this anesthetic.    Possible side effects of EXPAREL: Temporary loss of sensation or ability to move in the area where medication was injected. Nausea, vomiting, constipation Rarely, numbness and tingling in your mouth or lips, lightheadedness, or anxiety may occur. Call your doctor right away if you think you may be experiencing any of these sensations, or if you have other questions regarding possible side effects.  Follow all other discharge instructions given to you by your surgeon or nurse. Eat a healthy diet and drink plenty of water or other fluids.

## 2023-05-06 NOTE — Progress Notes (Signed)
Assisted Dr. Lanetta Inch with right, adductor canal, popliteal, ultrasound guided block. Side rails up, monitors on throughout procedure. See vital signs in flow sheet. Tolerated Procedure well.

## 2023-05-06 NOTE — Transfer of Care (Signed)
Immediate Anesthesia Transfer of Care Note  Patient: Debbie Delgado  Procedure(s) Performed: OPEN REDUCTION INTERNAL FIXATION (ORIF) RIGHT TRIMALLEOLAR FRACTURE (Right: Ankle)  Patient Location: PACU  Anesthesia Type:GA combined with regional for post-op pain  Level of Consciousness: drowsy, patient cooperative, and responds to stimulation  Airway & Oxygen Therapy: Patient Spontanous Breathing and Patient connected to face mask oxygen  Post-op Assessment: Report given to RN and Post -op Vital signs reviewed and stable  Post vital signs: Reviewed and stable  Last Vitals:  Vitals Value Taken Time  BP    Temp    Pulse    Resp    SpO2      Last Pain:  Vitals:   05/06/23 1133  TempSrc: Temporal  PainSc: 8       Patients Stated Pain Goal: 3 (05/06/23 1133)  Complications: No notable events documented.

## 2023-05-06 NOTE — Anesthesia Preprocedure Evaluation (Signed)
Anesthesia Evaluation  Patient identified by MRN, date of birth, ID band Patient awake    Reviewed: Allergy & Precautions, NPO status , Patient's Chart, lab work & pertinent test results  Airway Mallampati: II  TM Distance: >3 FB Neck ROM: Full    Dental no notable dental hx. (+) Teeth Intact, Dental Advisory Given   Pulmonary Current Smoker and Patient abstained from smoking.   Pulmonary exam normal breath sounds clear to auscultation       Cardiovascular negative cardio ROS Normal cardiovascular exam Rhythm:Regular Rate:Normal     Neuro/Psych negative neurological ROS  negative psych ROS   GI/Hepatic negative GI ROS, Neg liver ROS,,,  Endo/Other  negative endocrine ROS    Renal/GU negative Renal ROS  negative genitourinary   Musculoskeletal negative musculoskeletal ROS (+)    Abdominal   Peds  Hematology  (+) Blood dyscrasia, anemia   Anesthesia Other Findings   Reproductive/Obstetrics                             Anesthesia Physical Anesthesia Plan  ASA: 2  Anesthesia Plan: General and Regional   Post-op Pain Management: Regional block*   Induction: Intravenous  PONV Risk Score and Plan: 2 and Ondansetron, Dexamethasone and Midazolam  Airway Management Planned: LMA  Additional Equipment:   Intra-op Plan:   Post-operative Plan: Extubation in OR  Informed Consent: I have reviewed the patients History and Physical, chart, labs and discussed the procedure including the risks, benefits and alternatives for the proposed anesthesia with the patient or authorized representative who has indicated his/her understanding and acceptance.     Dental advisory given  Plan Discussed with: CRNA  Anesthesia Plan Comments:        Anesthesia Quick Evaluation

## 2023-05-06 NOTE — Anesthesia Procedure Notes (Signed)
Anesthesia Regional Block: Popliteal block   Pre-Anesthetic Checklist: , timeout performed,  Correct Patient, Correct Site, Correct Laterality,  Correct Procedure, Correct Position, site marked,  Risks and benefits discussed,  Pre-op evaluation,  At surgeon's request and post-op pain management  Laterality: Right  Prep: Maximum Sterile Barrier Precautions used, chloraprep       Needles:  Injection technique: Single-shot  Needle Type: Echogenic Stimulator Needle     Needle Length: 9cm  Needle Gauge: 21     Additional Needles:   Procedures:,,,, ultrasound used (permanent image in chart),,    Narrative:  Start time: 05/06/2023 12:30 PM End time: 05/06/2023 12:33 PM Injection made incrementally with aspirations every 5 mL. Anesthesiologist: Elmer Picker, MD

## 2023-05-06 NOTE — Op Note (Signed)
Date of Surgery: 05/06/2023  INDICATIONS: Debbie Delgado is a 52 y.o.-year-old female who sustained a right ankle fracture; she was indicated for open reduction and internal fixation due to the displaced nature of the articular fracture and came to the operating room today for this procedure. The patient did consent to the procedure after discussion of the risks and benefits.  PREOPERATIVE DIAGNOSIS: right trimalleolar ankle fracture dislocation  POSTOPERATIVE DIAGNOSIS: Same.  PROCEDURE: Open treatment of right ankle fracture with internal fixation. Trimalleolar w/o fixation of posterior malleolus CPT 27822.   SURGEON: Debbie Delgado, M.D.  ASSIST: Debbie Grout, PA-C  ANESTHESIA:  general, regional block  TOURNIQUET TIME: 60 mins  IV FLUIDS AND URINE: See anesthesia.  ESTIMATED BLOOD LOSS: minimal mL.  IMPLANTS: Implant Name Type Inv. Item Serial No. Manufacturer Lot No. LRB No. Used Action  SCREW NLOCK MVX 2.7X16 - [id number] Screw SCREW NLOCK MVX 2.7X16  ZIMMER RECON(ORTH,TRAU,BIO,SG) STERILE ON SET Right 1 Implanted  SCREW NON-LOCK 3.5X10 - [id number] Screw SCREW NON-LOCK 3.5X10  ZIMMER RECON(ORTH,TRAU,BIO,SG) STERILE ON SET Right 3 Implanted  ALPS MDX ANKLE, 6 hole anatomic fib. plate-Right Plate   ZIMMER RECON(ORTH,TRAU,BIO,SG) STERILE ON SET Right 1 Implanted  Alps mdx ankle, 2.9mm locking screw x14    ZIMMER RECON(ORTH,TRAU,BIO,SG) STERILE ON SET Right 2 Implanted  Alps mdx ankle, 4.20mm non-locking screw x14    ZIMMER RECON(ORTH,TRAU,BIO,SG) STERILE ON SET Right 1 Implanted  Alps mdx ankle, 3 hole medial hook plate    ZIMMER RECON(ORTH,TRAU,BIO,SG) STERILE ON SET Right 1 Implanted  Alps mdx ankle, 2.62mm non-locking screw x26    ZIMMER RECON(ORTH,TRAU,BIO,SG) STERILE ON SET Right 1 Implanted  Alps mdx ankle, 2.39mm locking screw x24    ZIMMER RECON(ORTH,TRAU,BIO,SG) STERILE ON SET Right 1 Implanted  Alps mdx ankle, 2.59mm locking screw x28    ZIMMER RECON(ORTH,TRAU,BIO,SG)  STERILE ON SET Right 2 Implanted    COMPLICATIONS: see description of procedure.  DESCRIPTION OF PROCEDURE: The patient was brought to the operating room and placed supine on the operating table.  The patient had been signed prior to the procedure and this was documented. The patient had the anesthesia placed by the anesthesiologist.  A nonsterile tourniquet was placed on the upper thigh.  The prep verification and incision time-outs were performed to confirm that this was the correct patient, site, side and location. The patient had an SCD on the opposite lower extremity. The patient did receive antibiotics prior to the incision and was re-dosed during the procedure as needed at indicated intervals.  The patient had the lower extremity prepped and draped in the standard surgical fashion.  The extremity was exsanguinated using an esmarch bandage and the tourniquet was inflated to 300 mm Hg.  An incision was created over the lateral aspect of the ankle.  Full-thickness flaps were created.  Dissection was carried down to the fibula.  Subperiosteal elevation was performed.  The fracture was encountered and organized hematoma was removed from the fracture site.  Reduction was achieved with a tenaculum clamp which was checked under fluoroscopy.  A lag screw was placed from anterior to posterior.  Compression across the fracture was visualized.  The clamp was removed and the fracture remained reduced.  A precontoured fibular plate was then placed on the fibula and a distal nonlocking screw was placed to secure the distal portion of the plate to the distal fibula.  2 additional locking screws were placed in the distal cluster.  3 nonlocking bicortical screws were placed to secure  the proximal portion of the plate to the fibula.  Each screw had excellent fixation.  I then turned my attention to the medial malleolus.  A separate incision was created over the medial malleolus.  Full-thickness flaps were elevated.  This  subperiosteal elevation was performed.  The medial malleolus fragment was too small to accommodate cannulated screws therefore a hook plate was chosen.  The fracture was reduced and the hook plate was tamped down onto the medial malleolus using fluoroscopic guidance.  I placed a nonlocking 2.7 millimeter screw through the proximal portion of the plate to secure the plate to the tibia.  This was placed parallel to the ankle joint.  This gave excellent fixation.  I then placed 3 additional locking screws to secure the plate to the distal tibia.  Stress exam of the ankle was then performed and showed no widening of the medial clear space or the syndesmosis interval.  The surgical sites were both thoroughly irrigated and closed in a layered fashion using 0 Vicryl, 2-0 Vicryl, 3-0 nylon.  Sterile dressings were applied.  Short leg splint was placed.  Patient tolerated the procedure well had no many complications. Debbie Delgado, my PA, was a medical necessity for the entirety of the surgery including opening, closing, limb positioning, retracting, exposing, and repairing.  POSTOPERATIVE PLAN: Debbie Delgado will remain nonweightbearing on this leg for approximately 6 weeks; Debbie Delgado will return for suture removal in 2 weeks.  Debbie Delgado will be immobilized in a short leg splint and then transitioned to a CAM walker at his first follow up appointment.  Debbie Delgado will receive DVT prophylaxis based on other medications, activity level, and risk ratio of bleeding to thrombosis.  Debbie Reel, MD [facility] 3:05 PM

## 2023-05-06 NOTE — Anesthesia Procedure Notes (Signed)
Anesthesia Regional Block: Adductor canal block   Pre-Anesthetic Checklist: , timeout performed,  Correct Patient, Correct Site, Correct Laterality,  Correct Procedure, Correct Position, site marked,  Risks and benefits discussed,  Pre-op evaluation,  At surgeon's request and post-op pain management  Laterality: Right  Prep: Maximum Sterile Barrier Precautions used, chloraprep       Needles:  Injection technique: Single-shot  Needle Type: Echogenic Stimulator Needle     Needle Length: 9cm  Needle Gauge: 21     Additional Needles:   Procedures:,,,, ultrasound used (permanent image in chart),,    Narrative:  Start time: 05/06/2023 12:33 PM End time: 05/06/2023 12:36 PM Injection made incrementally with aspirations every 5 mL. Anesthesiologist: Elmer Picker, MD

## 2023-05-06 NOTE — H&P (Signed)
PREOPERATIVE H&P  Chief Complaint: right trimalleolar fx  HPI: Debbie Delgado is a 52 y.o. female who presents for surgical treatment of right trimalleolar fx.  She denies any changes in medical history.  Past Surgical History:  Procedure Laterality Date   BREAST BIOPSY Right 12/08/2022   MM RT BREAST BX W LOC DEV 1ST LESION IMAGE BX SPEC STEREO GUIDE 12/08/2022 [facility]   BREAST BIOPSY  02/16/2023   MM RT RADIOACTIVE SEED LOC MAMMO GUIDE 02/16/2023 [facility]   BREAST LUMPECTOMY WITH RADIOACTIVE SEED LOCALIZATION Right 02/17/2023   Procedure: RIGHT BREAST LUMPECTOMY WITH RADIOACTIVE SEED LOCALIZATION;  Surgeon: Harriette Bouillon, MD;  Location: [facility];  Service: General;  Laterality: Right;   CARPAL TUNNEL RELEASE     TONSILLECTOMY     Social History   Socioeconomic History   Marital status: Divorced    Spouse name: Not on file   Number of children: Not on file   Years of education: Not on file   Highest education level: Not on file  Occupational History   Not on file  Tobacco Use   Smoking status: Every Day    Current packs/day: 0.50    Types: Cigarettes   Smokeless tobacco: Never   Tobacco comments:    1/2 PPD  Substance and Sexual Activity   Alcohol use: Yes    Comment: occasionally   Drug use: No   Sexual activity: Not on file  Other Topics Concern   Not on file  Social History Narrative   Not on file   Social Determinants of Health   Financial Resource Strain: Not on file  Food Insecurity: Not on file  Transportation Needs: Not on file  Physical Activity: Not on file  Stress: No Stress Concern Present (04/16/2021)   Received from [facility], [facility]   [employer] [facility] - Occupational Stress Questionnaire    Feeling of Stress : Not at all  Social Connections: Unknown (12/06/2021)   Received from [employer], [facility]   Social Network    Social Network: Not on file   Family  History  Problem Relation Age of Onset   Allergic rhinitis Sister    Breast cancer Maternal Grandmother    Allergic rhinitis Brother    Angioedema Neg Hx    Asthma Neg Hx    Atopy Neg Hx    Eczema Neg Hx    Immunodeficiency Neg Hx    Urticaria Neg Hx    Allergies  Allergen Reactions   Bupropion Other (See Comments)    Severe dry mouth    Fluoxetine Other (See Comments)    Hot flashes     Venlafaxine Hcl Other (See Comments)    Hot flashes    Latex Hives   Tylenol [Acetaminophen] Nausea And Vomiting    "I can't take tylenol" NVD   Codeine Itching and Nausea And Vomiting   Prior to Admission medications   Medication Sig Start Date End Date Taking? Authorizing Provider  azelastine (ASTELIN) 0.1 % nasal spray Place 2 sprays into both nostrils 2 (two) times daily. Use in each nostril as directed Patient taking differently: Place 2 sprays into both nostrils daily as needed for allergies or rhinitis. Use in each nostril as directed 02/02/23  Yes Alfonse Spruce, MD  cycloSPORINE (RESTASIS) 0.05 % ophthalmic emulsion Place 1 drop into both eyes 2 (two) times daily.   Yes [provider]  EPINEPHrine (EPIPEN 2-PAK) 0.3 mg/0.3 mL IJ  SOAJ injection Inject 0.3 mg into the muscle as needed for anaphylaxis. 12/05/22  Yes Alfonse Spruce, MD  ibuprofen (ADVIL) 600 MG tablet Take 1 tablet (600 mg total) by mouth every 6 (six) hours as needed. 04/24/23  Yes Sherian Maroon A, PA  ondansetron (ZOFRAN) 4 MG tablet Take 1 tablet (4 mg total) by mouth every 8 (eight) hours as needed for nausea or vomiting. 05/04/23   Cristie Hem, PA-C  oxyCODONE (ROXICODONE) 5 MG immediate release tablet Take 1 tablet (5 mg total) by mouth every 6 (six) hours as needed for up to 7 days. 05/06/23 05/13/23  Cristie Hem, PA-C     Positive ROS: All other systems have been reviewed and were otherwise negative with the exception of those mentioned in the HPI and as above.  Physical  Exam: General: Alert, no acute distress Cardiovascular: No pedal edema Respiratory: No cyanosis, no use of accessory musculature GI: abdomen soft Skin: No lesions in the area of chief complaint Neurologic: Sensation intact distally Psychiatric: Patient is competent for consent with normal mood and affect Lymphatic: no lymphedema  MUSCULOSKELETAL: exam stable  Assessment: right trimalleolar fx  Plan: Plan for Procedure(s): OPEN REDUCTION INTERNAL FIXATION (ORIF) RIGHT TRIMALLEOLAR FRACTURE  The risks benefits and alternatives were discussed with the patient including but not limited to the risks of nonoperative treatment, versus surgical intervention including infection, bleeding, nerve injury,  blood clots, cardiopulmonary complications, morbidity, mortality, among others, and they were willing to proceed.   Glee Arvin, MD 05/06/2023 11:11 AM

## 2023-05-06 NOTE — Anesthesia Procedure Notes (Signed)
Procedure Name: LMA Insertion Date/Time: 05/06/2023 1:41 PM  Performed by: Burna Cash, CRNAPre-anesthesia Checklist: Patient identified, Emergency Drugs available, Suction available and Patient being monitored Patient Re-evaluated:Patient Re-evaluated prior to induction Oxygen Delivery Method: Circle system utilized Preoxygenation: Pre-oxygenation with 100% oxygen Induction Type: IV induction Ventilation: Mask ventilation without difficulty LMA: LMA inserted LMA Size: 4.0 Number of attempts: 1 Airway Equipment and Method: Bite block Placement Confirmation: positive ETCO2 Tube secured with: Tape Dental Injury: Teeth and Oropharynx as per pre-operative assessment

## 2023-05-07 ENCOUNTER — Other Ambulatory Visit: Payer: Self-pay | Admitting: Physician Assistant

## 2023-05-07 ENCOUNTER — Telehealth: Payer: Self-pay | Admitting: Orthopaedic Surgery

## 2023-05-07 ENCOUNTER — Encounter: Payer: Self-pay | Admitting: Orthopaedic Surgery

## 2023-05-07 NOTE — Telephone Encounter (Signed)
Ok, thanks.

## 2023-05-07 NOTE — Telephone Encounter (Signed)
Patient called. Says she can not take percocet. She is allergic to tylenol. Her [phone]

## 2023-05-07 NOTE — Telephone Encounter (Signed)
I sent in plain oxy yesterday

## 2023-05-07 NOTE — Telephone Encounter (Signed)
Called and notified patient that oxycodone was sent in yesterday to [facility] in [place]

## 2023-05-07 NOTE — Telephone Encounter (Signed)
Patient called. Says she still hasn't had pain medication and she is in a lot of pain. Her cb# is [phone]

## 2023-05-07 NOTE — Telephone Encounter (Signed)
This was sent in yesterday.  Ice and elevate.  Can add ibuprofen if needed

## 2023-05-07 NOTE — Anesthesia Postprocedure Evaluation (Signed)
Anesthesia Post Note  Patient: MERRISA SKORUPSKI  Procedure(s) Performed: OPEN REDUCTION INTERNAL FIXATION (ORIF) RIGHT TRIMALLEOLAR FRACTURE (Right: Ankle)     Patient location during evaluation: PACU Anesthesia Type: Regional and General Level of consciousness: awake and alert Pain management: pain level controlled Vital Signs Assessment: post-procedure vital signs reviewed and stable Respiratory status: spontaneous breathing, nonlabored ventilation, respiratory function stable and patient connected to nasal cannula oxygen Cardiovascular status: blood pressure returned to baseline and stable Postop Assessment: no apparent nausea or vomiting Anesthetic complications: no  No notable events documented.  Last Vitals:  Vitals:   05/06/23 1612 05/06/23 1621  BP: 118/82 (!) 113/90  Pulse: 73 72  Resp: 16 16  Temp:  36.6 C  SpO2: 98% 99%    Last Pain:  Vitals:   05/06/23 1644  TempSrc:   PainSc: 4                  Mairely Foxworth L Jarel Cuadra

## 2023-05-08 ENCOUNTER — Telehealth: Payer: Self-pay | Admitting: Orthopaedic Surgery

## 2023-05-08 ENCOUNTER — Telehealth: Payer: Self-pay

## 2023-05-08 ENCOUNTER — Other Ambulatory Visit: Payer: Self-pay | Admitting: Physician Assistant

## 2023-05-08 MED ORDER — KETOROLAC TROMETHAMINE 10 MG PO TABS: 10.0000 mg | ORAL_TABLET | Freq: Two times a day (BID) | ORAL | 0 refills | Status: DC | PRN

## 2023-05-08 MED ORDER — METHOCARBAMOL 750 MG PO TABS: 750.0000 mg | ORAL_TABLET | Freq: Two times a day (BID) | ORAL | 0 refills | Status: DC | PRN

## 2023-05-08 NOTE — Telephone Encounter (Signed)
Spoke with pharmacy. They advised not to take Toradol along with aspirin due to adverse side effects. Debbie Delgado is aware and patient notified.

## 2023-05-08 NOTE — Telephone Encounter (Signed)
Spoke to patient

## 2023-05-08 NOTE — Telephone Encounter (Signed)
Patient called back and said that [facility] needed you to call them ASAP to fill the medication because its a conflict of interest. Its the [facility] [phone]. WG#[phone]

## 2023-05-08 NOTE — Telephone Encounter (Signed)
Pt is s/p a ORIF trimal ankle fracture 05/06/2023 pt is stating that she needs efill of Oxycodone that she only has 9 tabs left and can not make it through the weekend. Pt has had qty #100 since 04/24/2023 please advise.  Uses the [facility] pharmacy in [place]

## 2023-05-08 NOTE — Telephone Encounter (Signed)
Patient called returning your call. WU#[phone]

## 2023-05-08 NOTE — Telephone Encounter (Signed)
Sent in robaxin and toradol.  Make sure she is not taking any other nsaids other than the aspirin 81mg  bid

## 2023-05-08 NOTE — Telephone Encounter (Signed)
Patient called. Says she can not take toradol.

## 2023-05-08 NOTE — Telephone Encounter (Signed)
Patient called crying and she said she is in very bad pain and the pain medication she is taken isn't working. QI#[phone]

## 2023-05-08 NOTE — Telephone Encounter (Signed)
spent

## 2023-05-09 ENCOUNTER — Encounter: Payer: Self-pay | Admitting: Orthopaedic Surgery

## 2023-05-09 ENCOUNTER — Telehealth: Payer: Self-pay | Admitting: Orthopedic Surgery

## 2023-05-09 DIAGNOSIS — M25571 Pain in right ankle and joints of right foot: Secondary | ICD-10-CM

## 2023-05-09 MED ORDER — OXYCODONE HCL 5 MG PO TABS
5.0000 mg | ORAL_TABLET | ORAL | 0 refills | Status: DC | PRN
Start: 2023-05-09 — End: 2023-05-14

## 2023-05-09 NOTE — Telephone Encounter (Signed)
Orthopedic Telephone Call  Patient called saying she is having ankle pain after surgery. She said that she out of her pain medication. Stated that Dr. Roda Shutters sent in something with tylenol and she is allergic to tylenol. It looks like her last prescription was for oxycodone based on the PDMP report. I filled a new prescription of oxycodone.    London Sheer, MD Orthopedic Surgeon

## 2023-05-09 NOTE — Telephone Encounter (Signed)
That's a lot of oxys.  Seems like that should be enough.

## 2023-05-11 ENCOUNTER — Other Ambulatory Visit: Payer: Self-pay | Admitting: Physician Assistant

## 2023-05-11 MED ORDER — OXYCODONE HCL 5 MG PO TABS: 5.0000 mg | ORAL_TABLET | Freq: Four times a day (QID) | ORAL | 0 refills | Status: AC | PRN

## 2023-05-11 NOTE — Telephone Encounter (Signed)
This message came back to my box at 8:15 pm on Friday. Can you follow up with pt?

## 2023-05-11 NOTE — Telephone Encounter (Signed)
Communicated with patient via MyChart.

## 2023-05-11 NOTE — Telephone Encounter (Signed)
Moore sent in on Saturday.  I have sent a refill, but it may be too early to pick up.  Instructions are different from dr moores so please let her know

## 2023-05-11 NOTE — Telephone Encounter (Signed)
thanks

## 2023-05-12 ENCOUNTER — Other Ambulatory Visit: Payer: Self-pay | Admitting: Orthopaedic Surgery

## 2023-05-13 ENCOUNTER — Other Ambulatory Visit: Payer: Self-pay | Admitting: Physician Assistant

## 2023-05-13 ENCOUNTER — Telehealth: Payer: Self-pay | Admitting: Physician Assistant

## 2023-05-13 ENCOUNTER — Telehealth: Payer: Self-pay | Admitting: Orthopaedic Surgery

## 2023-05-13 MED ORDER — METHOCARBAMOL 750 MG PO TABS: 750.0000 mg | ORAL_TABLET | Freq: Two times a day (BID) | ORAL | 0 refills | Status: DC | PRN

## 2023-05-13 NOTE — Telephone Encounter (Signed)
The directions for how the medicine is to be taken. I explained that you may not change that, but patient said it cannot be filled early otherwise.

## 2023-05-13 NOTE — Telephone Encounter (Signed)
Pt called stating she can not find her methocarbamol and can another refill be sent. She states her caregiver looked all over the house they they are nowhere to be found. Please call pt about this matter at [phone].

## 2023-05-13 NOTE — Telephone Encounter (Signed)
Pt called in stating her pharmacy would not refill her medication until 05/16/23, called pharmacy and they stated her Medicaid will not pay for the refill yet she has to wait until 05/16/23. Called pt she stated that the wrong medication was called in and she was requesting Methocarbamol not Oxycodone, called pharmacy and they told me if we change the dosage to the Methocarbamol she can get the refill no problem, I called the pt to advise her on this and also that Lillia Abed is out of office and Roda Shutters was in surgery this afternoon and it is a possibility that it may not get filled until tomorrow, please advise on dosage change and refill

## 2023-05-13 NOTE — Telephone Encounter (Signed)
What is SIG?

## 2023-05-13 NOTE — Telephone Encounter (Signed)
Notified patient.

## 2023-05-13 NOTE — Telephone Encounter (Signed)
The correct medication (Methocarbamol) was sent to pharmacy. However, patient states that insurance will not approve refill unless the SIG is changed.  Patient lost the bottle of the original methocarbamol in her home, which is why she needed it resent to pharmacy.

## 2023-05-13 NOTE — Telephone Encounter (Signed)
Sent to [facility]

## 2023-05-14 ENCOUNTER — Other Ambulatory Visit: Payer: Self-pay | Admitting: Orthopaedic Surgery

## 2023-05-14 ENCOUNTER — Telehealth: Payer: Self-pay | Admitting: Orthopaedic Surgery

## 2023-05-14 MED ORDER — METHOCARBAMOL 500 MG PO TABS: 500.0000 mg | ORAL_TABLET | Freq: Three times a day (TID) | ORAL | 2 refills | Status: DC | PRN

## 2023-05-14 NOTE — Telephone Encounter (Signed)
Oh ok. Thanks.

## 2023-05-14 NOTE — Telephone Encounter (Signed)
sent 

## 2023-05-14 NOTE — Telephone Encounter (Signed)
Patient called advised to give her a call back regarding her medication. Debbie Delgado

## 2023-05-17 ENCOUNTER — Other Ambulatory Visit: Payer: Self-pay | Admitting: Physician Assistant

## 2023-05-18 ENCOUNTER — Telehealth: Payer: Self-pay | Admitting: Orthopaedic Surgery

## 2023-05-18 ENCOUNTER — Other Ambulatory Visit: Payer: Self-pay | Admitting: Physician Assistant

## 2023-05-18 ENCOUNTER — Telehealth: Payer: Self-pay | Admitting: Physician Assistant

## 2023-05-18 MED ORDER — OXYCODONE HCL 5 MG PO TABS
5.0000 mg | ORAL_TABLET | Freq: Three times a day (TID) | ORAL | 0 refills | Status: DC | PRN
Start: 2023-05-18 — End: 2023-05-29

## 2023-05-18 MED ORDER — METHOCARBAMOL 750 MG PO TABS: 750.0000 mg | ORAL_TABLET | Freq: Two times a day (BID) | ORAL | 0 refills | Status: DC | PRN

## 2023-05-18 NOTE — Telephone Encounter (Signed)
Pharmacy called stating the RX for methocarb that was sent in today cannot be filled. It is too soon to fill. In order to switch from going through insurance to cash pay for patient, they must have documentation from provider. Patient had just gotten a 10 day supply on 17th. Call pharmacy to advise

## 2023-05-18 NOTE — Telephone Encounter (Signed)
Tried to call patient. Pharmacy states that methocarbamol 60tablets was just filled a couple days ago. She is able to pick up her oxycodone script.

## 2023-05-18 NOTE — Telephone Encounter (Signed)
Sent in oxycodone and decreased frequency.  Refilled robaxin as well

## 2023-05-18 NOTE — Telephone Encounter (Signed)
I replied to her message.

## 2023-05-18 NOTE — Telephone Encounter (Signed)
Pt called in stating [facility] stated they would not fill in Rx until they speak with a provider. Please call pt after speaking with pharmacy

## 2023-05-18 NOTE — Telephone Encounter (Signed)
Spoke with pharmacy. Pharmacist said that Methocarbamol was just filled a couple days ago with 60tablets by Dr.Xu.

## 2023-05-18 NOTE — Telephone Encounter (Signed)
Pt also requesting Methocarbamol refill please refill

## 2023-05-18 NOTE — Telephone Encounter (Signed)
Pt requesting OxyContin refill sent to [facility] on file

## 2023-05-18 NOTE — Telephone Encounter (Signed)
Spoke with pharmacy. They are filling the oxycocone script, not the Methocarbamol.

## 2023-05-20 ENCOUNTER — Ambulatory Visit: Payer: Medicaid Other | Admitting: Orthopaedic Surgery

## 2023-05-20 ENCOUNTER — Other Ambulatory Visit (INDEPENDENT_AMBULATORY_CARE_PROVIDER_SITE_OTHER): Payer: Medicaid Other

## 2023-05-20 DIAGNOSIS — M25571 Pain in right ankle and joints of right foot: Secondary | ICD-10-CM

## 2023-05-20 DIAGNOSIS — M25561 Pain in right knee: Secondary | ICD-10-CM

## 2023-05-20 MED ORDER — METHOCARBAMOL 500 MG PO TABS: 500.0000 mg | ORAL_TABLET | Freq: Three times a day (TID) | ORAL | 2 refills | Status: DC | PRN

## 2023-05-20 MED ORDER — IBUPROFEN 800 MG PO TABS: 800.0000 mg | ORAL_TABLET | Freq: Three times a day (TID) | ORAL | 2 refills | Status: AC | PRN

## 2023-05-20 NOTE — Progress Notes (Addendum)
Office Visit Note   Patient: Debbie Delgado           Date of Birth: Aug 12, 1970           MRN: [id number] Visit Date: 05/20/2023              Requested by: Orlene Plum, NP [address] [place],  [place] [address] PCP: Orlene Plum, NP   Assessment & Plan: Visit Diagnoses:  1. Pain in right ankle and joints of right foot   2. Acute pain of right knee     Plan: Debbie Delgado is doing well status post ORIF right trimalar.  We remove the sutures in place Steri-Strips.  Continue nonweightbearing.  She was fitted with a cam boot and ASO brace for bedtime.  Continue aspirin for DVT prophylaxis.  Robaxin and Motrin prescribed.  She does not need any more oxycodone.  For the knee this is likely a sprain or contusion when she originally fell and broke her ankle.  I would just recommend symptomatic treatment.  Nothing concerning on my exam.  Will see her back in 4 weeks with repeat x-rays of the right ankle.  Follow-Up Instructions: Return in about 4 weeks (around 06/17/2023) for with lindsey.   Orders:  Orders Placed This Encounter  Procedures   XR Ankle Complete Right   XR Knee 1-2 Views Right   Meds ordered this encounter  Medications   methocarbamol (ROBAXIN) 500 MG tablet    Sig: Take 1 tablet (500 mg total) by mouth every 8 (eight) hours as needed for muscle spasms.    Dispense:  30 tablet    Refill:  2   ibuprofen (ADVIL) 800 MG tablet    Sig: Take 1 tablet (800 mg total) by mouth every 8 (eight) hours as needed.    Dispense:  30 tablet    Refill:  2      Procedures: No procedures performed   Clinical Data: No additional findings.   Subjective: Chief Complaint  Patient presents with   Right Ankle - Follow-up    ORIF trimalleolar ankle fracture 05/06/2023    HPI Debbie Delgado is 2 weeks postop from a right trimalar ankle fracture.  She is doing okay and regards to the ankle.  She is reporting anterior knee pain that is worse with using the knee scooter.  She  reports a prior partial ACL tear.   Review of Systems  Constitutional: Negative.   HENT: Negative.    Eyes: Negative.   Respiratory: Negative.    Cardiovascular: Negative.   Endocrine: Negative.   Musculoskeletal: Negative.   Neurological: Negative.   Hematological: Negative.   Psychiatric/Behavioral: Negative.    All other systems reviewed and are negative.    Objective: Vital Signs: LMP 05/22/2017   Physical Exam Vitals and nursing note reviewed.  Constitutional:      Appearance: She is well-developed.  HENT:     Head: Normocephalic and atraumatic.  Pulmonary:     Effort: Pulmonary effort is normal.  Abdominal:     Palpations: Abdomen is soft.  Musculoskeletal:     Cervical back: Neck supple.  Skin:    General: Skin is warm.     Capillary Refill: Capillary refill takes less than 2 seconds.  Neurological:     Mental Status: She is alert and oriented to person, place, and time.  Psychiatric:        Behavior: Behavior normal.        Thought Content: Thought content normal.  Judgment: Judgment normal.     Ortho Exam Exam of the right ankle shows healed surgical incisions.  No signs of infection.  Minimal swelling.  No neurovascular compromise.  Right knee exam is normal.  Specialty Comments:  No specialty comments available.  Imaging: XR Ankle Complete Right  Result Date: 05/20/2023 X-rays of the right ankle show status post ORIF of a trimalleolar ankle fracture with plate and screws with good alignment and fixation.  XR Knee 1-2 Views Right  Result Date: 05/20/2023 X-rays of the right knee show no acute or structural abnormalities.    PMFS History: Patient Active Problem List   Diagnosis Date Noted   Closed displaced trimalleolar fracture of right lower leg 04/25/2023   Seasonal and perennial allergic rhinitis 12/22/2019   Anemia 06/05/2017   Abnormal uterine bleeding (AUB) 06/05/2017   Past Medical History:  Diagnosis Date   Cancer (HCC)     cervical   Complication of anesthesia    "hard to wake" hard to put out   Ectopic pregnancy    Endometriosis    Fibroid     Family History  Problem Relation Age of Onset   Allergic rhinitis Sister    Breast cancer Maternal Grandmother    Allergic rhinitis Brother    Angioedema Neg Hx    Asthma Neg Hx    Atopy Neg Hx    Eczema Neg Hx    Immunodeficiency Neg Hx    Urticaria Neg Hx     Past Surgical History:  Procedure Laterality Date   BREAST BIOPSY Right 12/08/2022   MM RT BREAST BX W LOC DEV 1ST LESION IMAGE BX SPEC STEREO GUIDE 12/08/2022 [facility]   BREAST BIOPSY  02/16/2023   MM RT RADIOACTIVE SEED LOC MAMMO GUIDE 02/16/2023 [facility]   BREAST LUMPECTOMY WITH RADIOACTIVE SEED LOCALIZATION Right 02/17/2023   Procedure: RIGHT BREAST LUMPECTOMY WITH RADIOACTIVE SEED LOCALIZATION;  Surgeon: Harriette Bouillon, MD;  Location: [facility];  Service: General;  Laterality: Right;   CARPAL TUNNEL RELEASE     TONSILLECTOMY     Social History   Occupational History   Not on file  Tobacco Use   Smoking status: Every Day    Current packs/day: 0.50    Types: Cigarettes   Smokeless tobacco: Never   Tobacco comments:    1/2 PPD  Substance and Sexual Activity   Alcohol use: Yes    Comment: occasionally   Drug use: No   Sexual activity: Not on file

## 2023-05-21 ENCOUNTER — Encounter (HOSPITAL_BASED_OUTPATIENT_CLINIC_OR_DEPARTMENT_OTHER): Payer: Self-pay | Admitting: Orthopaedic Surgery

## 2023-05-21 ENCOUNTER — Ambulatory Visit (INDEPENDENT_AMBULATORY_CARE_PROVIDER_SITE_OTHER): Payer: Medicaid Other | Admitting: Allergy & Immunology

## 2023-05-21 DIAGNOSIS — J302 Other seasonal allergic rhinitis: Secondary | ICD-10-CM

## 2023-05-21 DIAGNOSIS — J3089 Other allergic rhinitis: Secondary | ICD-10-CM | POA: Diagnosis not present

## 2023-05-21 DIAGNOSIS — R059 Cough, unspecified: Secondary | ICD-10-CM

## 2023-05-21 DIAGNOSIS — R053 Chronic cough: Secondary | ICD-10-CM

## 2023-05-21 MED ORDER — MONTELUKAST SODIUM 10 MG PO TABS: 10.0000 mg | ORAL_TABLET | Freq: Every day | ORAL | 1 refills | Status: DC

## 2023-05-21 MED ORDER — AZELASTINE HCL 0.1 % NA SOLN: 2.0000 | Freq: Two times a day (BID) | NASAL | 1 refills | Status: DC

## 2023-05-21 NOTE — Patient Instructions (Addendum)
1. Rhinitis medicamentosa with overlying perennial and seasonal allergic rhinitis - Previous testing showed: grasses, weeds, indoor molds and outdoor molds - Stop the Flonase and start Nasonex two sprays per nostril twice daily. - Start taking Astelin (azelastine) two sprays per nostril twice daily. - Start Singulair (montelukast) 10mg  daily  - Start carbinoxamine 4mg  every 8 hours as needed. - Strongly consider allergy shots for long term control.   2. Follow up in 6 months or earlier if needed.

## 2023-05-21 NOTE — Progress Notes (Signed)
RE: Debbie GLACKEN MRN: [id number] DOB: [birth year] Date of Telemedicine Visit: 05/21/2023  Referring provider: Orlene Plum, NP Primary care provider: Orlene Plum, NP  Chief Complaint: Medication Management   Telemedicine Follow Up Visit via Telephone: I connected with Debbie Delgado for a follow up on 05/26/23 by telephone and verified that I am speaking with the correct person using two identifiers.   I discussed the limitations, risks, security and privacy concerns of performing an evaluation and management service by telephone and the availability of in person appointments. I also discussed with the patient that there may be a patient responsible charge related to this service. The patient expressed understanding and agreed to proceed.  Patient is at home.  Provider is at the office.  Visit start time: 6:00 PM Visit end time: 6:25 PM Insurance consent/check in by: Dr. Reece Agar Medical consent and medical assistant/nurse: Dr. Reece Agar  History of Present Illness:  She is a 52 y.o. female, who is being followed for allergic rhinitis with postnasal drip. Her previous allergy office visit was in July 2024 with myself. At that visit, we continue with her allergy shots which were going well. We did reassure her that these would space out to every two weeks once she reached her top dose of her Red Vial. We also told her that we could fill out FMLA forms for her to have more flexibility at her workplace to get time off to get the shots.   Since the last visit, she has had a rather complicated course.   Debbie Delgado reports significant improvement in allergy symptoms since starting allergy shots. She has discontinued the use of nasal spray due to the effectiveness of the shots. She was not having any symptoms with her shots, including any large local reactions or systemic reactions.   However, the patient recently sustained a severe ankle injury, breaking it in four places after a fall from  her porch. This occurred shortly after her breast surgery. The ankle required surgical intervention, which was performed recently, and the patient is now in a boot cast. She is unable to drive or work, with an estimated recovery time of five to six months. The patient is currently managing mobility with crutches, but reports difficulty due to the layout of her home.   She is exploring options for transportation assistance through [facility] for medical appointments. The patient's allergy treatment has been disrupted due to the injury, and she expresses concern about maintaining the progress made with the allergy shots. But at this point her allergy symptoms have not been too severe.   Otherwise, there have been no changes to her past medical history, surgical history, family history, or social history.  Assessment and Plan:  Debbie Delgado is a 52 y.o. female with:  Cough - likely from postnasal drip (resolved with adequate control of postnasal drip)   Rhinitis medicamentosa - improved   Perennial and seasonal allergic rhinitis (grasses, weeds, indoor molds and outdoor molds) - previously doing well on allergen immunotherapy   1. Rhinitis medicamentosa with overlying perennial and seasonal allergic rhinitis - Previous testing showed: grasses, weeds, indoor molds and outdoor molds - Continue Flonase and start Nasonex two sprays per nostril twice daily. - Continue Astelin (azelastine) two sprays per nostril twice daily. - Continue Singulair (montelukast) 10mg  daily  - Continue carbinoxamine 4mg  every 8 hours as needed. - Strongly consider allergy shots for long term control.   2. Follow up in six months or earlier if needed.  Diagnostics: None.  Medication List:  Current Outpatient Medications  Medication Sig Dispense Refill   gabapentin (NEURONTIN) 100 MG capsule Take 1 capsule (100 mg total) by mouth 3 (three) times daily as needed. 90 capsule 0   montelukast (SINGULAIR) 10 MG tablet Take 1  tablet (10 mg total) by mouth at bedtime. 90 tablet 1   aspirin 81 MG chewable tablet Chew by mouth daily.     azelastine (ASTELIN) 0.1 % nasal spray Place 2 sprays into both nostrils 2 (two) times daily. Use in each nostril as directed 90 mL 1   cycloSPORINE (RESTASIS) 0.05 % ophthalmic emulsion Place 1 drop into both eyes 2 (two) times daily.     EPINEPHrine (EPIPEN 2-PAK) 0.3 mg/0.3 mL IJ SOAJ injection Inject 0.3 mg into the muscle as needed for anaphylaxis. 2 each 1   ibuprofen (ADVIL) 800 MG tablet Take 1 tablet (800 mg total) by mouth every 8 (eight) hours as needed. 30 tablet 2   methocarbamol (ROBAXIN) 500 MG tablet Take 1 tablet (500 mg total) by mouth every 8 (eight) hours as needed for muscle spasms. 30 tablet 2   methocarbamol (ROBAXIN-750) 750 MG tablet Take 1 tablet (750 mg total) by mouth 2 (two) times daily as needed for muscle spasms. 20 tablet 0   ondansetron (ZOFRAN) 4 MG tablet Take 1 tablet (4 mg total) by mouth every 8 (eight) hours as needed for nausea or vomiting. 40 tablet 0   oxyCODONE (ROXICODONE) 5 MG immediate release tablet Take 1 tablet (5 mg total) by mouth 3 (three) times daily as needed for severe pain (pain score 7-10). 28 tablet 0   No current facility-administered medications for this visit.   Allergies: Allergies  Allergen Reactions   Bupropion Other (See Comments)    Severe dry mouth    Fluoxetine Other (See Comments)    Hot flashes     Venlafaxine Hcl Other (See Comments)    Hot flashes    Latex Hives   Tylenol [Acetaminophen] Nausea And Vomiting    "I can't take tylenol" NVD   Codeine Itching and Nausea And Vomiting   I reviewed her past medical history, social history, family history, and environmental history and no significant changes have been reported from previous visits.  Review of Systems  Constitutional:  Negative for activity change, appetite change, chills, fatigue and fever.  HENT:  Negative for congestion, postnasal drip,  rhinorrhea, sinus pressure and sore throat.   Eyes:  Negative for pain, discharge, redness and itching.  Respiratory:  Negative for shortness of breath, wheezing and stridor.   Gastrointestinal:  Negative for diarrhea, nausea and vomiting.  Endocrine: Negative for cold intolerance and heat intolerance.  Musculoskeletal:  Negative for arthralgias, joint swelling and myalgias.  Skin:  Negative for rash.  Allergic/Immunologic: Positive for environmental allergies. Negative for food allergies.    Objective:  Physical exam not obtained as encounter was done via telephone.   Previous notes and tests were reviewed.  I discussed the assessment and treatment plan with the patient. The patient was provided an opportunity to ask questions and all were answered. The patient agreed with the plan and demonstrated an understanding of the instructions.   The patient was advised to call back or seek an in-person evaluation if the symptoms worsen or if the condition fails to improve as anticipated.  I provided 25 minutes of non-face-to-face time during this encounter.  It was my pleasure to participate in Debbie Delgado's care today. Please feel free  to contact me with any questions or concerns.   Sincerely,  Alfonse Spruce, MD

## 2023-05-25 ENCOUNTER — Other Ambulatory Visit: Payer: Self-pay | Admitting: Physician Assistant

## 2023-05-25 ENCOUNTER — Telehealth: Payer: Self-pay | Admitting: Orthopaedic Surgery

## 2023-05-25 MED ORDER — GABAPENTIN 100 MG PO CAPS: 100.0000 mg | ORAL_CAPSULE | Freq: Three times a day (TID) | ORAL | 0 refills | Status: DC | PRN

## 2023-05-25 NOTE — Telephone Encounter (Signed)
Spoke with patient. Explained that the oxycodone cannot be filled again until 05/27/2023. The muscle relaxer cannot be filled again until 05/28/2023.

## 2023-05-25 NOTE — Telephone Encounter (Signed)
Pt requesting refills on Oxycodone and also stating if she touches her big toe it send a shock of pain on the life side of that foot and its too intense that she cannot do what she has been asked, pt would like to dr to call

## 2023-05-25 NOTE — Telephone Encounter (Signed)
That's part of healing.  Maybe an irritated nerve or something.

## 2023-05-25 NOTE — Telephone Encounter (Signed)
The only narcotic rx in chart is plain oxycodone (which is what we have been giving her.  She does not tolerate tylenol so that is why it is without this) which I sent in on 10/21.  It is too early to refill.  Robaxin was also sent in last week and is too early to refill.  I have sent in gabapentin to try

## 2023-05-25 NOTE — Telephone Encounter (Signed)
thanks

## 2023-05-26 ENCOUNTER — Encounter: Payer: Self-pay | Admitting: Allergy & Immunology

## 2023-05-28 ENCOUNTER — Telehealth: Payer: Self-pay | Admitting: Orthopaedic Surgery

## 2023-05-28 NOTE — Telephone Encounter (Signed)
Pt stated Roda Shutters said he was going to refill her pain meds today I advised her he was not in office and it may not get done today she said to see if anybody can fill it for her, stated she only has transportation to get it between 3-5 today

## 2023-05-28 NOTE — Telephone Encounter (Signed)
Pt stated Roda Shutters said he was going to refill her pain meds  OxyContin today I advised her he was not in office and it may not get done today she said to see if anybody can fill it for her, stated she only has transportation to get it between 3-5 today sent to [facility] in [place]

## 2023-05-29 ENCOUNTER — Other Ambulatory Visit: Payer: Self-pay | Admitting: Physician Assistant

## 2023-05-29 MED ORDER — OXYCODONE HCL 5 MG PO TABS: 5.0000 mg | ORAL_TABLET | Freq: Three times a day (TID) | ORAL | 0 refills | Status: DC | PRN

## 2023-05-29 MED ORDER — METHOCARBAMOL 750 MG PO TABS: 750.0000 mg | ORAL_TABLET | Freq: Two times a day (BID) | ORAL | 0 refills | Status: AC | PRN

## 2023-05-29 NOTE — Telephone Encounter (Signed)
Sent in oxycodone and robaxin.  Will need to start weaning to something less strong after this

## 2023-06-01 ENCOUNTER — Encounter: Payer: Self-pay | Admitting: Orthopaedic Surgery

## 2023-06-02 ENCOUNTER — Telehealth: Payer: Medicaid Other | Admitting: Physician Assistant

## 2023-06-02 ENCOUNTER — Encounter: Payer: Self-pay | Admitting: Orthopaedic Surgery

## 2023-06-02 DIAGNOSIS — L7682 Other postprocedural complications of skin and subcutaneous tissue: Secondary | ICD-10-CM

## 2023-06-02 NOTE — Progress Notes (Signed)
Virtual Visit Consent   Debbie Delgado, you are scheduled for a virtual visit with a [employer] provider today. Just as with appointments in the office, your consent must be obtained to participate. Your consent will be active for this visit and any virtual visit you may have with one of our providers in the next 365 days. If you have a MyChart account, a copy of this consent can be sent to you electronically.  As this is a virtual visit, video technology does not allow for your provider to perform a traditional examination. This may limit your provider's ability to fully assess your condition. If your provider identifies any concerns that need to be evaluated in person or the need to arrange testing (such as labs, EKG, etc.), we will make arrangements to do so. Although advances in technology are sophisticated, we cannot ensure that it will always work on either your end or our end. If the connection with a video visit is poor, the visit may have to be switched to a telephone visit. With either a video or telephone visit, we are not always able to ensure that we have a secure connection.  By engaging in this virtual visit, you consent to the provision of healthcare and authorize for your insurance to be billed (if applicable) for the services provided during this visit. Depending on your insurance coverage, you may receive a charge related to this service.  I need to obtain your verbal consent now. Are you willing to proceed with your visit today? Debbie Delgado has provided verbal consent on 06/02/2023 for a virtual visit (video or telephone). [name], PA-C  Date: 06/02/2023 8:16 AM  Virtual Visit via Video Note   I, [name], connected with  Debbie Delgado  ([id number], September 14, 1970) on 06/02/23 at  7:45 AM EST by a video-enabled telemedicine application and verified that I am speaking with the correct person using two identifiers.  Location: Patient: Virtual Visit Location  Patient: Home Provider: Virtual Visit Location Provider: Home Office   I discussed the limitations of evaluation and management by telemedicine and the availability of in person appointments. The patient expressed understanding and agreed to proceed.    History of Present Illness: Debbie Delgado is a 52 y.o. who identifies as a female who was assigned female at birth, and is being seen today for possible infection of surgical incision. Had surgery on 05/06/23 for trimalleolar ankle fracture. Yesterday evening she noticed that her incision was becoming very tender and burning when anything touched her incision. She has also noticed that her boot had felt like it was rubbing, so she had tried to pad the boot more in this same area. She felt maybe the boot had been rubbing it too much, so she removed the boot. It has continued to become more sensitive and painful to any touch. There is an area of redness just above the medial malleolus noted. No drainage. She reports slightly more warm than the rest of her ankle. Denies fevers, chills, nausea, vomiting.   She has messaged her Surgeon, Dr. Roda Shutters, later last night.    Problems:  Patient Active Problem List   Diagnosis Date Noted   Closed displaced trimalleolar fracture of right lower leg 04/25/2023   Seasonal and perennial allergic rhinitis 12/22/2019   Anemia 06/05/2017   Abnormal uterine bleeding (AUB) 06/05/2017    Allergies:  Allergies  Allergen Reactions   Bupropion Other (See Comments)    Severe dry mouth  Fluoxetine Other (See Comments)    Hot flashes     Venlafaxine Hcl Other (See Comments)    Hot flashes    Latex Hives   Tylenol [Acetaminophen] Nausea And Vomiting    "I can't take tylenol" NVD   Codeine Itching and Nausea And Vomiting   Medications:  Current Outpatient Medications:    gabapentin (NEURONTIN) 100 MG capsule, Take 1 capsule (100 mg total) by mouth 3 (three) times daily as needed., Disp: 90 capsule, Rfl: 0    aspirin 81 MG chewable tablet, Chew by mouth daily., Disp: , Rfl:    azelastine (ASTELIN) 0.1 % nasal spray, Place 2 sprays into both nostrils 2 (two) times daily. Use in each nostril as directed, Disp: 90 mL, Rfl: 1   cycloSPORINE (RESTASIS) 0.05 % ophthalmic emulsion, Place 1 drop into both eyes 2 (two) times daily., Disp: , Rfl:    EPINEPHrine (EPIPEN 2-PAK) 0.3 mg/0.3 mL IJ SOAJ injection, Inject 0.3 mg into the muscle as needed for anaphylaxis., Disp: 2 each, Rfl: 1   ibuprofen (ADVIL) 800 MG tablet, Take 1 tablet (800 mg total) by mouth every 8 (eight) hours as needed., Disp: 30 tablet, Rfl: 2   methocarbamol (ROBAXIN) 500 MG tablet, Take 1 tablet (500 mg total) by mouth every 8 (eight) hours as needed for muscle spasms., Disp: 30 tablet, Rfl: 2   methocarbamol (ROBAXIN-750) 750 MG tablet, Take 1 tablet (750 mg total) by mouth 2 (two) times daily as needed for muscle spasms., Disp: 20 tablet, Rfl: 0   montelukast (SINGULAIR) 10 MG tablet, Take 1 tablet (10 mg total) by mouth at bedtime., Disp: 90 tablet, Rfl: 1   ondansetron (ZOFRAN) 4 MG tablet, Take 1 tablet (4 mg total) by mouth every 8 (eight) hours as needed for nausea or vomiting., Disp: 40 tablet, Rfl: 0   oxyCODONE (ROXICODONE) 5 MG immediate release tablet, Take 1 tablet (5 mg total) by mouth 3 (three) times daily as needed for severe pain (pain score 7-10)., Disp: 21 tablet, Rfl: 0  Observations/Objective: Patient is well-developed, well-nourished in no acute distress.  Resting comfortably at home.  Head is normocephalic, atraumatic.  No labored breathing.  Speech is clear and coherent with logical content.  Patient is alert and oriented at baseline.  Dark erythema in distal surgical incision just above the medial malleolus of the right ankle, no drainage or dehiscence noted  Assessment and Plan: 1. Pain at surgical incision  - Discussed that she should follow up in person with Orthopedics/Surgeon for post-op complications and  evaluation. Discussed that they may prefer labs and imaging to rule out any deeper infection around the hardware. She voiced understanding and agreed to call first thing this morning. I will also forward the note to Dr. Roda Shutters so that he is aware of issues as well.   Follow Up Instructions: I discussed the assessment and treatment plan with the patient. The patient was provided an opportunity to ask questions and all were answered. The patient agreed with the plan and demonstrated an understanding of the instructions.  A copy of instructions were sent to the patient via MyChart unless otherwise noted below.    The patient was advised to call back or seek an in-person evaluation if the symptoms worsen or if the condition fails to improve as anticipated.    [name], PA-C

## 2023-06-02 NOTE — Telephone Encounter (Signed)
Debbie Layman, do you mind calling her and addressing this please.  Thanks.

## 2023-06-02 NOTE — Telephone Encounter (Signed)
Thank you :)

## 2023-06-02 NOTE — Patient Instructions (Signed)
Debbie Delgado, thank you for joining [name], PA-C for today's virtual visit.  While this provider is not your primary care provider (PCP), if your PCP is located in our provider database this encounter information will be shared with them immediately following your visit.   A [facility] MyChart account gives you access to today's visit and all your visits, tests, and labs performed at [facility] " click here if you don't have a [facility] MyChart account or go to [email or web address]  Consent: (Patient) Debbie Delgado provided verbal consent for this virtual visit at the beginning of the encounter.  Current Medications:  Current Outpatient Medications:    gabapentin (NEURONTIN) 100 MG capsule, Take 1 capsule (100 mg total) by mouth 3 (three) times daily as needed., Disp: 90 capsule, Rfl: 0   aspirin 81 MG chewable tablet, Chew by mouth daily., Disp: , Rfl:    azelastine (ASTELIN) 0.1 % nasal spray, Place 2 sprays into both nostrils 2 (two) times daily. Use in each nostril as directed, Disp: 90 mL, Rfl: 1   cycloSPORINE (RESTASIS) 0.05 % ophthalmic emulsion, Place 1 drop into both eyes 2 (two) times daily., Disp: , Rfl:    EPINEPHrine (EPIPEN 2-PAK) 0.3 mg/0.3 mL IJ SOAJ injection, Inject 0.3 mg into the muscle as needed for anaphylaxis., Disp: 2 each, Rfl: 1   ibuprofen (ADVIL) 800 MG tablet, Take 1 tablet (800 mg total) by mouth every 8 (eight) hours as needed., Disp: 30 tablet, Rfl: 2   methocarbamol (ROBAXIN) 500 MG tablet, Take 1 tablet (500 mg total) by mouth every 8 (eight) hours as needed for muscle spasms., Disp: 30 tablet, Rfl: 2   methocarbamol (ROBAXIN-750) 750 MG tablet, Take 1 tablet (750 mg total) by mouth 2 (two) times daily as needed for muscle spasms., Disp: 20 tablet, Rfl: 0   montelukast (SINGULAIR) 10 MG tablet, Take 1 tablet (10 mg total) by mouth at bedtime., Disp: 90 tablet, Rfl: 1   ondansetron (ZOFRAN) 4 MG tablet, Take 1 tablet (4 mg  total) by mouth every 8 (eight) hours as needed for nausea or vomiting., Disp: 40 tablet, Rfl: 0   oxyCODONE (ROXICODONE) 5 MG immediate release tablet, Take 1 tablet (5 mg total) by mouth 3 (three) times daily as needed for severe pain (pain score 7-10)., Disp: 21 tablet, Rfl: 0   Medications ordered in this encounter:  No orders of the defined types were placed in this encounter.    *If you need refills on other medications prior to your next appointment, please contact your pharmacy*  Follow-Up: Call back or seek an in-person evaluation if the symptoms worsen or if the condition fails to improve as anticipated.  [facility] [phone]  Other Instructions Call Orthopedics/Surgeon for further recommendations   If you have been instructed to have an in-person evaluation today at a local Urgent Care facility, please use the link below. It will take you to a list of all of our available [facility], including address, phone number and hours of operation. Please do not delay care.  [facility]  If you or a family member do not have a primary care provider, use the link below to schedule a visit and establish care. When you choose a [facility] primary care physician or advanced practice provider, you gain a long-term partner in health. Find a Primary Care Provider  Learn more about [facility]'s in-office and virtual care options:  -  Get Care Now

## 2023-06-02 NOTE — Telephone Encounter (Signed)
Spoke to patient.  Recommended ice and elevation.  Already taking gabapentin.

## 2023-06-03 ENCOUNTER — Encounter: Payer: Medicaid Other | Admitting: Physician Assistant

## 2023-06-03 ENCOUNTER — Other Ambulatory Visit: Payer: Self-pay | Admitting: Physician Assistant

## 2023-06-03 ENCOUNTER — Encounter: Payer: Self-pay | Admitting: Orthopaedic Surgery

## 2023-06-03 MED ORDER — DOXYCYCLINE HYCLATE 100 MG PO CAPS: 100.0000 mg | ORAL_CAPSULE | Freq: Two times a day (BID) | ORAL | 0 refills | Status: DC

## 2023-06-03 NOTE — Telephone Encounter (Signed)
Needs to be seen.  If she is saying she cannot come today, she needs to come tomorrow.  I have sent in doxycycline to start asap

## 2023-06-03 NOTE — Telephone Encounter (Signed)
She needs to be evaluated in person if she says there's puss.

## 2023-06-04 ENCOUNTER — Other Ambulatory Visit (INDEPENDENT_AMBULATORY_CARE_PROVIDER_SITE_OTHER): Payer: Medicaid Other

## 2023-06-04 ENCOUNTER — Encounter: Payer: Self-pay | Admitting: Orthopaedic Surgery

## 2023-06-04 ENCOUNTER — Ambulatory Visit (INDEPENDENT_AMBULATORY_CARE_PROVIDER_SITE_OTHER): Payer: Medicaid Other | Admitting: Orthopaedic Surgery

## 2023-06-04 DIAGNOSIS — S82851A Displaced trimalleolar fracture of right lower leg, initial encounter for closed fracture: Secondary | ICD-10-CM

## 2023-06-04 NOTE — Progress Notes (Signed)
Post-Op Visit Note   Patient: Debbie Delgado           Date of Birth: [birth year]           MRN: [id number] Visit Date: 06/04/2023 PCP: Orlene Plum, NP   Assessment & Plan:  Chief Complaint:  Chief Complaint  Patient presents with   Right Ankle - Follow-up    Right ankle ORIF trimalleolar fracture 05/06/2023   Visit Diagnoses:  1. Closed displaced trimalleolar fracture of right ankle, initial encounter     Plan: Debbie Delgado is a 52 year old female 4 weeks status post ORIF right trimalar ankle fracture.  She comes in today for concern of infection from the lateral incision.  She states that the boot has been irritating this area.  Denies any constitutional symptoms.    Exam of the right ankle shows cellulitis over the lateral incision.  There is no drainage.  Is slightly swollen.  She has developed a cellulitis over the lateral incision.  We will place her on doxycycline which we sent in yesterday but she has not started yet.  X-rays of the ankle are stable.  I would like to recheck her ankle in a week.  Follow-Up Instructions: Return in about 1 week (around 06/11/2023) for with lindsey.   Orders:  Orders Placed This Encounter  Procedures   XR Ankle Complete Right   No orders of the defined types were placed in this encounter.   Imaging: XR Ankle Complete Right  Result Date: 06/04/2023 X-rays of the right ankle show stable ORIF of the ankle.  No hardware complications.   PMFS History: Patient Active Problem List   Diagnosis Date Noted   Closed displaced trimalleolar fracture of right lower leg 04/25/2023   Seasonal and perennial allergic rhinitis 12/22/2019   Anemia 06/05/2017   Abnormal uterine bleeding (AUB) 06/05/2017   Past Medical History:  Diagnosis Date   Cancer (HCC)    cervical   Complication of anesthesia    "hard to wake" hard to put out   Ectopic pregnancy    Endometriosis    Fibroid     Family History  Problem Relation Age of Onset    Allergic rhinitis Sister    Breast cancer Maternal Grandmother    Allergic rhinitis Brother    Angioedema Neg Hx    Asthma Neg Hx    Atopy Neg Hx    Eczema Neg Hx    Immunodeficiency Neg Hx    Urticaria Neg Hx     Past Surgical History:  Procedure Laterality Date   BREAST BIOPSY Right 12/08/2022   MM RT BREAST BX W LOC DEV 1ST LESION IMAGE BX SPEC STEREO GUIDE 12/08/2022 [facility]   BREAST BIOPSY  02/16/2023   MM RT RADIOACTIVE SEED LOC MAMMO GUIDE 02/16/2023 [facility]   BREAST LUMPECTOMY WITH RADIOACTIVE SEED LOCALIZATION Right 02/17/2023   Procedure: RIGHT BREAST LUMPECTOMY WITH RADIOACTIVE SEED LOCALIZATION;  Surgeon: Harriette Bouillon, MD;  Location: [facility];  Service: General;  Laterality: Right;   CARPAL TUNNEL RELEASE     ORIF ANKLE FRACTURE Right 05/06/2023   Procedure: OPEN REDUCTION INTERNAL FIXATION (ORIF) RIGHT TRIMALLEOLAR FRACTURE;  Surgeon: Tarry Kos, MD;  Location: [facility];  Service: Orthopedics;  Laterality: Right;  block   TONSILLECTOMY     Social History   Occupational History   Not on file  Tobacco Use   Smoking status: Every Day    Current packs/day: 0.50    Types:  Cigarettes   Smokeless tobacco: Never   Tobacco comments:    1/2 PPD  Substance and Sexual Activity   Alcohol use: Yes    Comment: occasionally   Drug use: No   Sexual activity: Not on file

## 2023-06-05 ENCOUNTER — Telehealth: Payer: Self-pay | Admitting: Physician Assistant

## 2023-06-05 ENCOUNTER — Telehealth: Payer: Self-pay | Admitting: Orthopaedic Surgery

## 2023-06-05 ENCOUNTER — Encounter: Payer: Self-pay | Admitting: Orthopaedic Surgery

## 2023-06-05 ENCOUNTER — Other Ambulatory Visit: Payer: Self-pay | Admitting: Physician Assistant

## 2023-06-05 MED ORDER — GABAPENTIN 100 MG PO CAPS: 100.0000 mg | ORAL_CAPSULE | Freq: Three times a day (TID) | ORAL | 0 refills | Status: AC | PRN

## 2023-06-05 MED ORDER — OXYCODONE HCL 5 MG PO TABS: 5.0000 mg | ORAL_TABLET | Freq: Three times a day (TID) | ORAL | 0 refills | Status: DC | PRN

## 2023-06-05 NOTE — Telephone Encounter (Signed)
Script was faxed as requested. Patient notified.

## 2023-06-05 NOTE — Telephone Encounter (Signed)
Pt called in stating Caryn Bee her Significant other has poured out off her Gabapentin and the pharmacy stated they will need to speak with the provider before it can be filled and she needs Prior auth for the Oxycodone

## 2023-06-05 NOTE — Telephone Encounter (Addendum)
Patient called advised Apothe[facility]ary asked if the Rx for a wheelchair could be faxed over. The fax# is [phone]    The number to contact patient is [phone]

## 2023-06-05 NOTE — Telephone Encounter (Signed)
Sent oxy and gabapentin

## 2023-06-07 ENCOUNTER — Encounter: Payer: Self-pay | Admitting: Orthopaedic Surgery

## 2023-06-08 ENCOUNTER — Encounter: Payer: Self-pay | Admitting: Orthopaedic Surgery

## 2023-06-08 NOTE — Telephone Encounter (Signed)
Sent MyChart message back to patient. Pharmacy was notified that it was okay to fill gabapentin.

## 2023-06-09 ENCOUNTER — Telehealth: Payer: Self-pay | Admitting: Orthopaedic Surgery

## 2023-06-09 ENCOUNTER — Telehealth: Payer: Self-pay | Admitting: Physician Assistant

## 2023-06-09 ENCOUNTER — Other Ambulatory Visit: Payer: Self-pay | Admitting: Physician Assistant

## 2023-06-09 MED ORDER — METHOCARBAMOL 500 MG PO TABS: 500.0000 mg | ORAL_TABLET | Freq: Two times a day (BID) | ORAL | 2 refills | Status: DC | PRN

## 2023-06-09 NOTE — Telephone Encounter (Signed)
Debbie Delgado from [place] returned call to [place]. Please call her back at [phone].

## 2023-06-09 NOTE — Telephone Encounter (Signed)
Pt is calling and complaining about pain meds please call pt xu or Lillia Abed needs to call

## 2023-06-10 ENCOUNTER — Encounter: Payer: Self-pay | Admitting: Orthopaedic Surgery

## 2023-06-10 ENCOUNTER — Other Ambulatory Visit: Payer: Self-pay | Admitting: Orthopaedic Surgery

## 2023-06-10 MED ORDER — AMOXICILLIN-POT CLAVULANATE 875-125 MG PO TABS: 1.0000 | ORAL_TABLET | Freq: Two times a day (BID) | ORAL | 0 refills | Status: DC

## 2023-06-10 NOTE — Telephone Encounter (Signed)
I just spoke to her yesterday afternoon

## 2023-06-11 ENCOUNTER — Encounter: Payer: Self-pay | Admitting: Orthopaedic Surgery

## 2023-06-11 ENCOUNTER — Encounter: Payer: Medicaid Other | Admitting: Orthopaedic Surgery

## 2023-06-12 ENCOUNTER — Ambulatory Visit (INDEPENDENT_AMBULATORY_CARE_PROVIDER_SITE_OTHER): Payer: Medicaid Other | Admitting: Orthopaedic Surgery

## 2023-06-12 DIAGNOSIS — S82851D Displaced trimalleolar fracture of right lower leg, subsequent encounter for closed fracture with routine healing: Secondary | ICD-10-CM

## 2023-06-12 NOTE — Progress Notes (Signed)
Post-Op Visit Note   Patient: Debbie Delgado           Date of Birth: 20-Mar-1971           MRN: [id number] Visit Date: 06/12/2023 PCP: Orlene Plum, NP   Assessment & Plan:  Chief Complaint:  Chief Complaint  Patient presents with   Right Ankle - Pain, Follow-up   Visit Diagnoses:  1. Closed displaced trimalleolar fracture of right ankle with routine healing, subsequent encounter     Plan: Patient is a 52 year old female who comes in today 5 weeks status post ORIF right trimalleolar ankle fracture 05/06/2023.  She noticed increased redness and slight drainage to use 1 small area of her wound last week.  She was placed on doxycycline.  She felt as though this was not helping and was unable to be brought in to see [place] last week.  Augmentin was added.  She feels as though her symptoms are improving.  No fevers or chills or any other systemic symptoms.  She is still taking oxycodone and Robaxin.  Examination of her right ankle reveals a healed medial incision.  The lateral incision shows 1 small area to the distal third with fibrinous exudate.  Minimal erythema.  She is neurovascularly intact distally.  Today, dry bandage was applied.  Continue nonweightbearing in a cam boot.  Continue to elevate for pain and swelling.  Follow-up next week for repeat evaluation and x-rays of the right ankle.  Call with concerns or questions.  Follow-Up Instructions: Return in about 1 week (around 06/19/2023).   Orders:  No orders of the defined types were placed in this encounter.  No orders of the defined types were placed in this encounter.   Imaging: No new imaging  PMFS History: Patient Active Problem List   Diagnosis Date Noted   Closed displaced trimalleolar fracture of right lower leg 04/25/2023   Anemia 06/05/2017   Abnormal uterine bleeding (AUB) 06/05/2017   Past Medical History:  Diagnosis Date   Cancer (HCC)    cervical   Complication of anesthesia    "hard to wake" hard  to put out   Ectopic pregnancy    Endometriosis    Fibroid     Family History  Problem Relation Age of Onset   Allergic rhinitis Sister    Breast cancer Maternal Grandmother    Allergic rhinitis Brother    Angioedema Neg Hx    Asthma Neg Hx    Atopy Neg Hx    Eczema Neg Hx    Immunodeficiency Neg Hx    Urticaria Neg Hx     Past Surgical History:  Procedure Laterality Date   BREAST BIOPSY Right 12/08/2022   MM RT BREAST BX W LOC DEV 1ST LESION IMAGE BX SPEC STEREO GUIDE 12/08/2022 [facility]   BREAST BIOPSY  02/16/2023   MM RT RADIOACTIVE SEED LOC MAMMO GUIDE 02/16/2023 [facility]   BREAST LUMPECTOMY WITH RADIOACTIVE SEED LOCALIZATION Right 02/17/2023   Procedure: RIGHT BREAST LUMPECTOMY WITH RADIOACTIVE SEED LOCALIZATION;  Surgeon: Harriette Bouillon, MD;  Location: [facility];  Service: General;  Laterality: Right;   CARPAL TUNNEL RELEASE     ORIF ANKLE FRACTURE Right 05/06/2023   Procedure: OPEN REDUCTION INTERNAL FIXATION (ORIF) RIGHT TRIMALLEOLAR FRACTURE;  Surgeon: Tarry Kos, MD;  Location: [facility];  Service: Orthopedics;  Laterality: Right;  block   TONSILLECTOMY     Social History   Occupational History   Not on file  Tobacco Use   Smoking status: Every Day    Current packs/day: 0.50    Types: Cigarettes   Smokeless tobacco: Never   Tobacco comments:    1/2 PPD  Substance and Sexual Activity   Alcohol use: Yes    Comment: occasionally   Drug use: No   Sexual activity: Not on file

## 2023-06-18 ENCOUNTER — Encounter: Payer: Self-pay | Admitting: Orthopaedic Surgery

## 2023-06-18 ENCOUNTER — Ambulatory Visit: Payer: Medicaid Other | Admitting: Physician Assistant

## 2023-06-18 ENCOUNTER — Other Ambulatory Visit: Payer: Self-pay

## 2023-06-18 VITALS — BP 121/80 | HR 96

## 2023-06-18 DIAGNOSIS — S82851D Displaced trimalleolar fracture of right lower leg, subsequent encounter for closed fracture with routine healing: Secondary | ICD-10-CM

## 2023-06-18 MED ORDER — AMOXICILLIN-POT CLAVULANATE 875-125 MG PO TABS: 1.0000 | ORAL_TABLET | Freq: Two times a day (BID) | ORAL | 0 refills | Status: AC

## 2023-06-18 NOTE — Progress Notes (Signed)
Post-Op Visit Note   Patient: Debbie Delgado           Date of Birth: [birth year]           MRN: [id number] Visit Date: 06/18/2023 PCP: Orlene Plum, NP   Assessment & Plan:  Chief Complaint:  Chief Complaint  Patient presents with   Right Ankle - Pain, Follow-up   Visit Diagnoses:  1. Closed displaced trimalleolar fracture of right ankle with routine healing, subsequent encounter     Plan: Patient is a 52 year old female who comes in today 6 weeks status post ORIF right trimalleolar ankle fracture, date of surgery 05/06/2023.  She is still in a fair amount of pain although she has not been taking oxycodone or gabapentin over the past week trying to figure out what is causing a migraine and high blood pressure.  She has 1 more dose of doxycycline and about a weeks worth of Augmentin.  She denies any fevers or chills or any other constitutional symptoms.  Examination of her right ankle reveals 2 small scabs to the lateral incision.  No drainage there.  Medial incision shows a small erythematous area to the distal aspect.  No drainage.  This is tender.  Calf is soft nontender.  She is neurovascularly intact distally.  At this point, we would like for her to finish the doxycycline.  I have sent in 1 more weeks worth of the Augmentin.  She may begin bearing weight in her cam boot.  I sent in a referral for outpatient physical therapy.  She will follow-up with [place] in 2 weeks for recheck.  Call with any concerns or questions in the meantime.  Follow-Up Instructions: Return in about 2 weeks (around 07/02/2023).   Orders:  Orders Placed This Encounter  Procedures   XR Ankle Complete Right   Meds ordered this encounter  Medications   amoxicillin-clavulanate (AUGMENTIN) 875-125 MG tablet    Sig: Take 1 tablet by mouth 2 (two) times daily for 10 days.    Dispense:  14 tablet    Refill:  0    Imaging: No results found.  PMFS History: Patient Active Problem List   Diagnosis Date  Noted   Closed displaced trimalleolar fracture of right lower leg 04/25/2023   Anemia 06/05/2017   Abnormal uterine bleeding (AUB) 06/05/2017   Past Medical History:  Diagnosis Date   Cancer (HCC)    cervical   Complication of anesthesia    "hard to wake" hard to put out   Ectopic pregnancy    Endometriosis    Fibroid     Family History  Problem Relation Age of Onset   Allergic rhinitis Sister    Breast cancer Maternal Grandmother    Allergic rhinitis Brother    Angioedema Neg Hx    Asthma Neg Hx    Atopy Neg Hx    Eczema Neg Hx    Immunodeficiency Neg Hx    Urticaria Neg Hx     Past Surgical History:  Procedure Laterality Date   BREAST BIOPSY Right 12/08/2022   MM RT BREAST BX W LOC DEV 1ST LESION IMAGE BX SPEC STEREO GUIDE 12/08/2022 [facility]   BREAST BIOPSY  02/16/2023   MM RT RADIOACTIVE SEED LOC MAMMO GUIDE 02/16/2023 [facility]   BREAST LUMPECTOMY WITH RADIOACTIVE SEED LOCALIZATION Right 02/17/2023   Procedure: RIGHT BREAST LUMPECTOMY WITH RADIOACTIVE SEED LOCALIZATION;  Surgeon: Harriette Bouillon, MD;  Location: [facility];  Service: General;  Laterality: Right;  CARPAL TUNNEL RELEASE     ORIF ANKLE FRACTURE Right 05/06/2023   Procedure: OPEN REDUCTION INTERNAL FIXATION (ORIF) RIGHT TRIMALLEOLAR FRACTURE;  Surgeon: Tarry Kos, MD;  Location: [facility];  Service: Orthopedics;  Laterality: Right;  block   TONSILLECTOMY     Social History   Occupational History   Not on file  Tobacco Use   Smoking status: Every Day    Current packs/day: 0.50    Types: Cigarettes   Smokeless tobacco: Never   Tobacco comments:    1/2 PPD  Substance and Sexual Activity   Alcohol use: Yes    Comment: occasionally   Drug use: No   Sexual activity: Not on file

## 2023-06-23 ENCOUNTER — Encounter: Payer: Self-pay | Admitting: Orthopaedic Surgery

## 2023-06-23 ENCOUNTER — Other Ambulatory Visit: Payer: Self-pay

## 2023-06-23 MED ORDER — METHOCARBAMOL 500 MG PO TABS: 500.0000 mg | ORAL_TABLET | Freq: Two times a day (BID) | ORAL | 6 refills | Status: DC | PRN

## 2023-06-23 NOTE — Telephone Encounter (Signed)
Approved 30 tablets 6 refills

## 2023-07-01 ENCOUNTER — Telehealth: Payer: Self-pay | Admitting: Orthopaedic Surgery

## 2023-07-01 ENCOUNTER — Other Ambulatory Visit: Payer: Self-pay

## 2023-07-01 DIAGNOSIS — S82851D Displaced trimalleolar fracture of right lower leg, subsequent encounter for closed fracture with routine healing: Secondary | ICD-10-CM

## 2023-07-01 NOTE — Telephone Encounter (Signed)
Patient called and said that Dr. Roda Shutters needs to send the referral to [address] For physical Therapy. KV#[phone]

## 2023-07-01 NOTE — Telephone Encounter (Signed)
Order for PT has been placed.

## 2023-07-03 ENCOUNTER — Encounter: Payer: Self-pay | Admitting: Orthopaedic Surgery

## 2023-07-03 ENCOUNTER — Telehealth: Payer: Self-pay | Admitting: Orthopaedic Surgery

## 2023-07-03 ENCOUNTER — Telehealth: Payer: Self-pay

## 2023-07-03 NOTE — Telephone Encounter (Signed)
Called and LMOM that we can give her another boot. It may or may not be covered by insurance. She will need to sign another one out with the DME tablet. I ask that she MyChart or call us back with a size and time that she wants to come get it.

## 2023-07-03 NOTE — Telephone Encounter (Signed)
Patient called stating that she is needing another CAM boot, due to hers being washed and getting destroyed.  CB# [phone].  Please advise.  Thank you.

## 2023-07-03 NOTE — Telephone Encounter (Signed)
Patient said she wears a size 7 shoe and her nephew Dorthula Perfect will pick up the shoe around 2:00 pm.    Pt number [phone]

## 2023-07-03 NOTE — Telephone Encounter (Signed)
Spoke with patient. Her nephew will come pick up a size Medium ASO around 2pm today. He will need to sign DME tablet at pick up.

## 2023-07-06 NOTE — Telephone Encounter (Signed)
Messaged patient via MyChart. I never saw her Friday and wanted to make sure she still needed the brace before I place it back on the shelf.

## 2023-07-07 ENCOUNTER — Encounter: Payer: Self-pay | Admitting: Orthopaedic Surgery

## 2023-07-07 NOTE — Telephone Encounter (Signed)
sure

## 2023-07-15 ENCOUNTER — Ambulatory Visit: Payer: Medicaid Other | Attending: Orthopaedic Surgery

## 2023-07-15 ENCOUNTER — Other Ambulatory Visit: Payer: Self-pay

## 2023-07-15 DIAGNOSIS — M25671 Stiffness of right ankle, not elsewhere classified: Secondary | ICD-10-CM | POA: Insufficient documentation

## 2023-07-15 DIAGNOSIS — M25571 Pain in right ankle and joints of right foot: Secondary | ICD-10-CM | POA: Insufficient documentation

## 2023-07-15 DIAGNOSIS — R600 Localized edema: Secondary | ICD-10-CM | POA: Insufficient documentation

## 2023-07-15 DIAGNOSIS — S82851D Displaced trimalleolar fracture of right lower leg, subsequent encounter for closed fracture with routine healing: Secondary | ICD-10-CM | POA: Insufficient documentation

## 2023-07-15 NOTE — Therapy (Signed)
OUTPATIENT PHYSICAL THERAPY LOWER EXTREMITY EVALUATION   Patient Name: Debbie Delgado MRN: [id number] DOB:07-18-1971, 52 y.o., female Today's Date: 07/15/2023  END OF SESSION:  PT End of Session - 07/15/23 1557     Visit Number 1    Number of Visits 16    Date for PT Re-Evaluation 09/25/23    PT Start Time 1607    PT Stop Time 1653    PT Time Calculation (min) 46 min    Activity Tolerance Patient limited by pain    Behavior During Therapy [facility] for tasks assessed/performed             Past Medical History:  Diagnosis Date   Cancer (HCC)    cervical   Complication of anesthesia    "hard to wake" hard to put out   Ectopic pregnancy    Endometriosis    Fibroid    Past Surgical History:  Procedure Laterality Date   BREAST BIOPSY Right 12/08/2022   MM RT BREAST BX W LOC DEV 1ST LESION IMAGE BX SPEC STEREO GUIDE 12/08/2022 [facility]   BREAST BIOPSY  02/16/2023   MM RT RADIOACTIVE SEED LOC MAMMO GUIDE 02/16/2023 [facility]   BREAST LUMPECTOMY WITH RADIOACTIVE SEED LOCALIZATION Right 02/17/2023   Procedure: RIGHT BREAST LUMPECTOMY WITH RADIOACTIVE SEED LOCALIZATION;  Surgeon: Harriette Bouillon, MD;  Location: [facility];  Service: General;  Laterality: Right;   CARPAL TUNNEL RELEASE     ORIF ANKLE FRACTURE Right 05/06/2023   Procedure: OPEN REDUCTION INTERNAL FIXATION (ORIF) RIGHT TRIMALLEOLAR FRACTURE;  Surgeon: Tarry Kos, MD;  Location: [facility];  Service: Orthopedics;  Laterality: Right;  block   TONSILLECTOMY     Patient Active Problem List   Diagnosis Date Noted   Closed displaced trimalleolar fracture of right lower leg 04/25/2023   Anemia 06/05/2017   Abnormal uterine bleeding (AUB) 06/05/2017   REFERRING PROVIDER: Tarry Kos, MD   REFERRING DIAG: Closed displaced trimalleolar fracture of right ankle with routine healing, subsequent encounter   THERAPY DIAG:  Stiffness of right ankle, not elsewhere  classified  Pain in right ankle and joints of right foot  Localized edema  Rationale for Evaluation and Treatment: Rehabilitation  ONSET DATE: 04/24/23 (fracture) and 05/06/23 (surgery)  SUBJECTIVE:   SUBJECTIVE STATEMENT: Patient reports that broke her right ankle on 04/24/23 after she stepped off of her steps at home. She went to the hospital after her fall, but they were unable to set it that day. She then had to go to [facility] the next day to get it set. She was unable to have surgery to repair the fracture until 05/06/23. She was told to begin putting weight on her foot, but it will tingle when she tries to put weight on it.   PERTINENT HISTORY: Allergies, current smoker, and history of cancer PAIN:  Are you having pain? Yes: NPRS scale: 8 Pain location: right ankle Pain description: constant soreness and burning Aggravating factors: weight bearing Relieving factors: ice and medication  PRECAUTIONS: None  RED FLAGS: None   WEIGHT BEARING RESTRICTIONS: Yes ; patient was told to begin WBAT  FALLS:  Has patient fallen in last 6 months? No  LIVING ENVIRONMENT: Lives with: lives alone Lives in: Mobile home Stairs: Yes: External: 4 steps; "yes, but they are not very sturdy" Has following equipment at home: shower chair and knee scooter  OCCUPATION: medical claims; works from home  PLOF: Independent  PATIENT GOALS: walk normally, navigate  her steps, and be able to do her job without being limited by her pain  NEXT MD VISIT: 07/27/23  OBJECTIVE:  Note: Objective measures were completed at Evaluation unless otherwise noted.  PATIENT SURVEYS:  FOTO 28.83  COGNITION: Overall cognitive status: Within functional limits for tasks assessed     SENSATION: Patient reports numbness and tingling around the incisions of her right ankle.  EDEMA:  Figure 8: L: 49.7 cm R: 52.4 cm   PALPATION:  Significant tenderness to palpation throughout her right foot and  ankle  LOWER EXTREMITY ROM:  Active ROM Right eval Left eval  Hip flexion    Hip extension    Hip abduction    Hip adduction    Hip internal rotation    Hip external rotation    Knee flexion    Knee extension    Ankle dorsiflexion -15; tight 0  Ankle plantarflexion 30; tight  54  Ankle inversion 8 24  Ankle eversion 3 21   (Blank rows = not tested)  LOWER EXTREMITY MMT: not tested due to surgical condition  FUNCTIONAL TESTS:  Unable to be assessed due to limited weight bearing through RLE  GAIT: Assistive device utilized:  knee scooter Level of assistance: Modified independence Comments: non weight bearing on RLE   TODAY'S TREATMENT:                                                                                                                              DATE:     PATIENT EDUCATION:  Education details: plan of care, healing, prognosis, anatomy, referred pain, and goals for physical therapy Person educated: Patient Education method: Explanation Education comprehension: verbalized understanding  HOME EXERCISE PROGRAM:   ASSESSMENT:  CLINICAL IMPRESSION: Patient is a 52 y.o. female who was seen today for physical therapy evaluation and treatment for right ankle pain and stiffness secondary to a right trimalleolar fracture on 04/24/23 and ORIF on 05/06/23. She presented with high pain severity and irritability with palpation to her right foot and ankle reproducing her familiar symptoms. Right ankle passive range of motion was unable to be assessed due to this increased sensitivity. Recommend that she continue with skilled physical therapy to address her impairments to return to her prior level of function.  OBJECTIVE IMPAIRMENTS: Abnormal gait, decreased activity tolerance, decreased balance, decreased mobility, difficulty walking, decreased ROM, decreased strength, hypomobility, increased edema, impaired sensation, impaired tone, and pain.   ACTIVITY LIMITATIONS:  carrying, lifting, standing, squatting, stairs, transfers, bathing, dressing, and locomotion level  PARTICIPATION LIMITATIONS: meal prep, cleaning, laundry, driving, shopping, community activity, and occupation  PERSONAL FACTORS: Past/current experiences, Time since onset of injury/illness/exacerbation, Transportation, and 3+ comorbidities: Allergies, current smoker, and history of cancer  are also affecting patient's functional outcome.   REHAB POTENTIAL: Good  CLINICAL DECISION MAKING: Evolving/moderate complexity  EVALUATION COMPLEXITY: Moderate   GOALS: Goals reviewed with patient? Yes  SHORT TERM GOALS: Target date: 08/12/23 Patient will be independent  with her initial HEP. Baseline: Goal status: INITIAL  2.  Patient will be able to complete her daily activities without her familiar pain exceeding 6/10. Baseline:  Goal status: INITIAL  3.  Patient will be able to exhibit 100% weightbearing through her right lower extremity for improved mobility. Baseline:  Goal status: INITIAL  4.  Patient will improve her right ankle dorsiflexion to within 8 degrees of neutral for improved ankle mobility. Baseline:  Goal status: INITIAL  LONG TERM GOALS: Target date: 09/09/23  Patient will be independent with her advanced HEP. Baseline:  Goal status: INITIAL  2.  Patient will be able to ambulate at least 80 feet without an assistive device for improved household mobility. Baseline:  Goal status: INITIAL  3.  Patient will be able to safely navigate at least 4 steps with a reciprocal pattern for improved household mobility. Baseline:  Goal status: INITIAL  4.  Patient will improve her right ankle dorsiflexion to neutral or greater for improved gait mechanics. Baseline:  Goal status: INITIAL  5.  Patient will be able to squat with no significant deviations for improved function picking up items from her floor. Baseline:  Goal status: INITIAL  PLAN:  PT FREQUENCY:  2x/week  PT DURATION: 8 weeks  PLANNED INTERVENTIONS: 97164- PT Re-evaluation, 97110-Therapeutic exercises, 97530- Therapeutic activity, 97112- Neuromuscular re-education, 97535- Self Care, 16109- Manual therapy, [id number]- Gait training, 97014- Electrical stimulation (unattended), 97016- Vasopneumatic device, 97034- Contrast bath, Patient/Family education, Balance training, Stair training, Joint mobilization, Cryotherapy, and Moist heat  PLAN FOR NEXT SESSION: NuStep, standing weight shift, desensitization, active and passive ankle range of motion, and modalities as needed   Granville Lewis, PT 07/15/2023, 5:22 PM

## 2023-07-17 ENCOUNTER — Encounter: Payer: Self-pay | Admitting: Orthopaedic Surgery

## 2023-07-19 NOTE — Telephone Encounter (Signed)
yes

## 2023-07-20 ENCOUNTER — Telehealth: Payer: Self-pay | Admitting: Orthopaedic Surgery

## 2023-07-20 NOTE — Telephone Encounter (Signed)
Patient called and said that she needs hand rails and can she get a prescription for it. UV#[phone]

## 2023-07-27 ENCOUNTER — Ambulatory Visit: Payer: Medicaid Other | Admitting: Orthopaedic Surgery

## 2023-07-27 ENCOUNTER — Encounter: Payer: Self-pay | Admitting: Orthopaedic Surgery

## 2023-07-28 ENCOUNTER — Ambulatory Visit: Payer: Medicaid Other

## 2023-07-28 DIAGNOSIS — M25571 Pain in right ankle and joints of right foot: Secondary | ICD-10-CM

## 2023-07-28 DIAGNOSIS — R600 Localized edema: Secondary | ICD-10-CM

## 2023-07-28 DIAGNOSIS — M25671 Stiffness of right ankle, not elsewhere classified: Secondary | ICD-10-CM | POA: Diagnosis not present

## 2023-07-28 NOTE — Therapy (Signed)
 OUTPATIENT PHYSICAL THERAPY LOWER EXTREMITY TREATMENT   Patient Name: Debbie Delgado MRN: [id number] DOB:1971/06/04, 52 y.o., female Today's Date: 07/28/2023  END OF SESSION:  PT End of Session - 07/28/23 1348     Visit Number 2    Number of Visits 16    Date for PT Re-Evaluation 09/25/23    PT Start Time 1345    PT Stop Time 1443    PT Time Calculation (min) 58 min    Activity Tolerance Patient limited by pain    Behavior During Therapy [facility] for tasks assessed/performed             Past Medical History:  Diagnosis Date   Cancer (HCC)    cervical   Complication of anesthesia    hard to wake hard to put out   Ectopic pregnancy    Endometriosis    Fibroid    Past Surgical History:  Procedure Laterality Date   BREAST BIOPSY Right 12/08/2022   MM RT BREAST BX W LOC DEV 1ST LESION IMAGE BX SPEC STEREO GUIDE 12/08/2022 [facility]   BREAST BIOPSY  02/16/2023   MM RT RADIOACTIVE SEED LOC MAMMO GUIDE 02/16/2023 [facility]   BREAST LUMPECTOMY WITH RADIOACTIVE SEED LOCALIZATION Right 02/17/2023   Procedure: RIGHT BREAST LUMPECTOMY WITH RADIOACTIVE SEED LOCALIZATION;  Surgeon: Vanderbilt Ned, MD;  Location: [facility];  Service: General;  Laterality: Right;   CARPAL TUNNEL RELEASE     ORIF ANKLE FRACTURE Right 05/06/2023   Procedure: OPEN REDUCTION INTERNAL FIXATION (ORIF) RIGHT TRIMALLEOLAR FRACTURE;  Surgeon: Jerri Kay HERO, MD;  Location: [facility];  Service: Orthopedics;  Laterality: Right;  block   TONSILLECTOMY     Patient Active Problem List   Diagnosis Date Noted   Closed displaced trimalleolar fracture of right lower leg 04/25/2023   Anemia 06/05/2017   Abnormal uterine bleeding (AUB) 06/05/2017   REFERRING PROVIDER: Jerri Kay HERO, MD   REFERRING DIAG: Closed displaced trimalleolar fracture of right ankle with routine healing, subsequent encounter   THERAPY DIAG:  Stiffness of right ankle, not elsewhere  classified  Pain in right ankle and joints of right foot  Localized edema  Rationale for Evaluation and Treatment: Rehabilitation  ONSET DATE: 04/24/23 (fracture) and 05/06/23 (surgery)  SUBJECTIVE:   SUBJECTIVE STATEMENT: Pt reports 6/10 right ankle pain today.  Pt ambulates in with quad cane and boot donned.   PERTINENT HISTORY: Allergies, current smoker, and history of cancer PAIN:  Are you having pain? Yes: NPRS scale: 6/10 Pain location: right ankle Pain description: constant soreness and burning Aggravating factors: weight bearing Relieving factors: ice and medication  PRECAUTIONS: None  RED FLAGS: None   WEIGHT BEARING RESTRICTIONS: Yes ; patient was told to begin WBAT  FALLS:  Has patient fallen in last 6 months? No  LIVING ENVIRONMENT: Lives with: lives alone Lives in: Mobile home Stairs: Yes: External: 4 steps; yes, but they are not very sturdy Has following equipment at home: shower chair and knee scooter  OCCUPATION: medical claims; works from home  PLOF: Independent  PATIENT GOALS: walk normally, navigate her steps, and be able to do her job without being limited by her pain  NEXT MD VISIT: 07/27/23  OBJECTIVE:  Note: Objective measures were completed at Evaluation unless otherwise noted.  PATIENT SURVEYS:  FOTO 28.83  COGNITION: Overall cognitive status: Within functional limits for tasks assessed     SENSATION: Patient reports numbness and tingling around the incisions of her right ankle.  EDEMA:  Figure 8: L: 49.7 cm R: 52.4 cm   PALPATION:  Significant tenderness to palpation throughout her right foot and ankle  LOWER EXTREMITY ROM:  Active ROM Right eval Left eval  Hip flexion    Hip extension    Hip abduction    Hip adduction    Hip internal rotation    Hip external rotation    Knee flexion    Knee extension    Ankle dorsiflexion -15; tight 0  Ankle plantarflexion 30; tight  54  Ankle inversion 8 24  Ankle eversion 3  21   (Blank rows = not tested)  LOWER EXTREMITY MMT: not tested due to surgical condition  FUNCTIONAL TESTS:  Unable to be assessed due to limited weight bearing through RLE  GAIT: Assistive device utilized:  knee scooter Level of assistance: Modified independence Comments: non weight bearing on RLE   TODAY'S TREATMENT:                                                                                                                              DATE:                 07/28/23                     EXERCISE LOG  Exercise Repetitions and Resistance Comments  Rockerboard Forward/backward & left/right x 3 mins each   Dynadisc Flex/ext; CW and CCW circles x 2 mins each   Towel Crunches 2 mins   Towel Swipes 2 mins   Marbles 12 marbles x 3 reps    Blank cell = exercise not performed today   Modalities  Date:  Vaso: Ankle, 34 degrees; low pressure, 15 mins, Pain and Edema   PATIENT EDUCATION:  Education details: plan of care, healing, prognosis, anatomy, referred pain, and goals for physical therapy Person educated: Patient Education method: Explanation Education comprehension: verbalized understanding  HOME EXERCISE PROGRAM:   ASSESSMENT:  CLINICAL IMPRESSION: Pt arrives for today's treatment session reporting 6/10 right ankle pain.  Pt introduced to several seated exercises today with good results.  Pt requiring min cues for proper technique with all newly added exercises.  Pt educated in different methods of desensitization to help with nerve pain.  Normal responses to vaso noted upon removal.  Pt reported minimal decrease in pain at completion of today's treatment session.  OBJECTIVE IMPAIRMENTS: Abnormal gait, decreased activity tolerance, decreased balance, decreased mobility, difficulty walking, decreased ROM, decreased strength, hypomobility, increased edema, impaired sensation, impaired tone, and pain.   ACTIVITY LIMITATIONS: carrying, lifting, standing, squatting,  stairs, transfers, bathing, dressing, and locomotion level  PARTICIPATION LIMITATIONS: meal prep, cleaning, laundry, driving, shopping, community activity, and occupation  PERSONAL FACTORS: Past/current experiences, Time since onset of injury/illness/exacerbation, Transportation, and 3+ comorbidities: Allergies, current smoker, and history of cancer  are also affecting patient's functional outcome.   REHAB POTENTIAL: Good  CLINICAL DECISION MAKING: Evolving/moderate complexity  EVALUATION COMPLEXITY: Moderate   GOALS: Goals reviewed with patient? Yes  SHORT TERM GOALS: Target date: 08/12/23 Patient will be independent with her initial HEP. Baseline: Goal status: INITIAL  2.  Patient will be able to complete her daily activities without her familiar pain exceeding 6/10. Baseline:  Goal status: INITIAL  3.  Patient will be able to exhibit 100% weightbearing through her right lower extremity for improved mobility. Baseline:  Goal status: INITIAL  4.  Patient will improve her right ankle dorsiflexion to within 8 degrees of neutral for improved ankle mobility. Baseline:  Goal status: INITIAL  LONG TERM GOALS: Target date: 09/09/23  Patient will be independent with her advanced HEP. Baseline:  Goal status: INITIAL  2.  Patient will be able to ambulate at least 80 feet without an assistive device for improved household mobility. Baseline:  Goal status: INITIAL  3.  Patient will be able to safely navigate at least 4 steps with a reciprocal pattern for improved household mobility. Baseline:  Goal status: INITIAL  4.  Patient will improve her right ankle dorsiflexion to neutral or greater for improved gait mechanics. Baseline:  Goal status: INITIAL  5.  Patient will be able to squat with no significant deviations for improved function picking up items from her floor. Baseline:  Goal status: INITIAL  PLAN:  PT FREQUENCY: 2x/week  PT DURATION: 8 weeks  PLANNED  INTERVENTIONS: 97164- PT Re-evaluation, 97110-Therapeutic exercises, 97530- Therapeutic activity, 97112- Neuromuscular re-education, 97535- Self Care, 02859- Manual therapy, 97116- Gait training, 97014- Electrical stimulation (unattended), 97016- Vasopneumatic device, 97034- Contrast bath, Patient/Family education, Balance training, Stair training, Joint mobilization, Cryotherapy, and Moist heat  PLAN FOR NEXT SESSION: NuStep, standing weight shift, desensitization, active and passive ankle range of motion, and modalities as needed  Delon DELENA Gosling, PTA 07/28/2023, 4:22 PM

## 2023-08-04 ENCOUNTER — Other Ambulatory Visit (INDEPENDENT_AMBULATORY_CARE_PROVIDER_SITE_OTHER): Payer: Medicaid Other

## 2023-08-04 ENCOUNTER — Ambulatory Visit (INDEPENDENT_AMBULATORY_CARE_PROVIDER_SITE_OTHER): Payer: Medicaid Other | Admitting: Physician Assistant

## 2023-08-04 DIAGNOSIS — S82851D Displaced trimalleolar fracture of right lower leg, subsequent encounter for closed fracture with routine healing: Secondary | ICD-10-CM

## 2023-08-04 NOTE — Progress Notes (Signed)
 Post-Op Visit Note   Patient: Debbie Delgado           Date of Birth: [birth year]           MRN: [id number] Visit Date: 08/04/2023 PCP: Rosan Jacquline NOVAK, NP   Assessment & Plan:  Chief Complaint:  Chief Complaint  Patient presents with   Right Ankle - Follow-up    ORIF RT TRIMALLEOLAR ANKLE FX 05/06/23   Visit Diagnoses:  1. Closed displaced trimalleolar fracture of right ankle with routine healing, subsequent encounter     Plan: Patient is approximately 3 months status post ORIF right trimalleolar ankle fracture, date of surgery 05/06/2023.  She has been doing much better.  Not taking anything for pain.  She is still ambulating in a cam boot and is just now started bearing weight.  She is in physical therapy.  She finished the doxycycline  and Augmentin  several weeks ago.  She tells me the pins and needle sensations have dissipated.  No fevers or chills or any other systemic symptoms.  Examination of the right ankle reveals fully healed surgical scars without complication.  Mild swelling.  No bony tenderness.  At this point, would really like for her to transition out of the cam boot and into an ASO brace as I am worried that she will develop an Achilles contracture.  She will continue with physical therapy.  She will follow-up with us  in 8 weeks for repeat evaluation and three-view x-rays of the right ankle.  Call with concerns or questions.  Follow-Up Instructions: Return in about 8 weeks (around 09/29/2023).   Orders:  Orders Placed This Encounter  Procedures   XR Ankle Complete Right   No orders of the defined types were placed in this encounter.   Imaging: XR Ankle Complete Right Result Date: 08/04/2023 X-rays demonstrate continued consolidation of the fracture sites.  No hardware complication.   PMFS History: Patient Active Problem List   Diagnosis Date Noted   Closed displaced trimalleolar fracture of right lower leg 04/25/2023   Anemia 06/05/2017   Abnormal  uterine bleeding (AUB) 06/05/2017   Past Medical History:  Diagnosis Date   Cancer (HCC)    cervical   Complication of anesthesia    hard to wake hard to put out   Ectopic pregnancy    Endometriosis    Fibroid     Family History  Problem Relation Age of Onset   Allergic rhinitis Sister    Breast cancer Maternal Grandmother    Allergic rhinitis Brother    Angioedema Neg Hx    Asthma Neg Hx    Atopy Neg Hx    Eczema Neg Hx    Immunodeficiency Neg Hx    Urticaria Neg Hx     Past Surgical History:  Procedure Laterality Date   BREAST BIOPSY Right 12/08/2022   MM RT BREAST BX W LOC DEV 1ST LESION IMAGE BX SPEC STEREO GUIDE 12/08/2022 [facility]   BREAST BIOPSY  02/16/2023   MM RT RADIOACTIVE SEED LOC MAMMO GUIDE 02/16/2023 [facility]   BREAST LUMPECTOMY WITH RADIOACTIVE SEED LOCALIZATION Right 02/17/2023   Procedure: RIGHT BREAST LUMPECTOMY WITH RADIOACTIVE SEED LOCALIZATION;  Surgeon: Vanderbilt Ned, MD;  Location: [facility];  Service: General;  Laterality: Right;   CARPAL TUNNEL RELEASE     ORIF ANKLE FRACTURE Right 05/06/2023   Procedure: OPEN REDUCTION INTERNAL FIXATION (ORIF) RIGHT TRIMALLEOLAR FRACTURE;  Surgeon: Jerri Kay HERO, MD;  Location: [facility];  Service: Orthopedics;  Laterality: Right;  block   TONSILLECTOMY     Social History   Occupational History   Not on file  Tobacco Use   Smoking status: Every Day    Current packs/day: 0.50    Types: Cigarettes   Smokeless tobacco: Never   Tobacco comments:    1/2 PPD  Substance and Sexual Activity   Alcohol use: Yes    Comment: occasionally   Drug use: No   Sexual activity: Not on file

## 2023-08-10 ENCOUNTER — Ambulatory Visit: Payer: Medicaid Other | Attending: Orthopaedic Surgery

## 2023-08-10 DIAGNOSIS — M25671 Stiffness of right ankle, not elsewhere classified: Secondary | ICD-10-CM | POA: Insufficient documentation

## 2023-08-10 DIAGNOSIS — R600 Localized edema: Secondary | ICD-10-CM | POA: Insufficient documentation

## 2023-08-10 DIAGNOSIS — M25571 Pain in right ankle and joints of right foot: Secondary | ICD-10-CM | POA: Diagnosis present

## 2023-08-10 NOTE — Therapy (Signed)
 OUTPATIENT PHYSICAL THERAPY LOWER EXTREMITY TREATMENT   Patient Name: Debbie Delgado MRN: [id number] DOB:08-30-1970, 53 y.o., female Today's Date: 08/10/2023  END OF SESSION:  PT End of Session - 08/10/23 1433     Visit Number 3    Number of Visits 16    Date for PT Re-Evaluation 09/25/23    PT Start Time 1430    PT Stop Time 1512    PT Time Calculation (min) 42 min    Activity Tolerance Patient limited by pain    Behavior During Therapy [facility] for tasks assessed/performed              Past Medical History:  Diagnosis Date   Cancer (HCC)    cervical   Complication of anesthesia    hard to wake hard to put out   Ectopic pregnancy    Endometriosis    Fibroid    Past Surgical History:  Procedure Laterality Date   BREAST BIOPSY Right 12/08/2022   MM RT BREAST BX W LOC DEV 1ST LESION IMAGE BX SPEC STEREO GUIDE 12/08/2022 [facility]   BREAST BIOPSY  02/16/2023   MM RT RADIOACTIVE SEED LOC MAMMO GUIDE 02/16/2023 [facility]   BREAST LUMPECTOMY WITH RADIOACTIVE SEED LOCALIZATION Right 02/17/2023   Procedure: RIGHT BREAST LUMPECTOMY WITH RADIOACTIVE SEED LOCALIZATION;  Surgeon: Vanderbilt Ned, MD;  Location: [facility];  Service: General;  Laterality: Right;   CARPAL TUNNEL RELEASE     ORIF ANKLE FRACTURE Right 05/06/2023   Procedure: OPEN REDUCTION INTERNAL FIXATION (ORIF) RIGHT TRIMALLEOLAR FRACTURE;  Surgeon: Jerri Kay HERO, MD;  Location: [facility];  Service: Orthopedics;  Laterality: Right;  block   TONSILLECTOMY     Patient Active Problem List   Diagnosis Date Noted   Closed displaced trimalleolar fracture of right lower leg 04/25/2023   Anemia 06/05/2017   Abnormal uterine bleeding (AUB) 06/05/2017   REFERRING PROVIDER: Jerri Kay HERO, MD   REFERRING DIAG: Closed displaced trimalleolar fracture of right ankle with routine healing, subsequent encounter   THERAPY DIAG:  Stiffness of right ankle, not elsewhere  classified  Pain in right ankle and joints of right foot  Localized edema  Rationale for Evaluation and Treatment: Rehabilitation  ONSET DATE: 04/24/23 (fracture) and 05/06/23 (surgery)  SUBJECTIVE:   SUBJECTIVE STATEMENT: Patient reports that her foot and 5th toe is hurting really bad right now. She feels that it was feeling better prior to putting her boot and brace on this morning. She feels like she can walk better without the brace and boot than she can with the boot.   PERTINENT HISTORY: Allergies, current smoker, and history of cancer PAIN:  Are you having pain? Yes: NPRS scale: 6/10 Pain location: right ankle Pain description: constant soreness and burning Aggravating factors: weight bearing Relieving factors: ice and medication  PRECAUTIONS: None  RED FLAGS: None   WEIGHT BEARING RESTRICTIONS: Yes ; patient was told to begin WBAT  FALLS:  Has patient fallen in last 6 months? No  LIVING ENVIRONMENT: Lives with: lives alone Lives in: Mobile home Stairs: Yes: External: 4 steps; yes, but they are not very sturdy Has following equipment at home: shower chair and knee scooter  OCCUPATION: medical claims; works from home  PLOF: Independent  PATIENT GOALS: walk normally, navigate her steps, and be able to do her job without being limited by her pain  NEXT MD VISIT: none scheduled  OBJECTIVE:  Note: Objective measures were completed at Evaluation unless otherwise noted.  PATIENT SURVEYS:  FOTO 28.83  COGNITION: Overall cognitive status: Within functional limits for tasks assessed     SENSATION: Patient reports numbness and tingling around the incisions of her right ankle.  EDEMA:  Figure 8: L: 49.7 cm R: 52.4 cm   PALPATION:  Significant tenderness to palpation throughout her right foot and ankle  LOWER EXTREMITY ROM:  Active ROM Right eval Left eval  Hip flexion    Hip extension    Hip abduction    Hip adduction    Hip internal rotation     Hip external rotation    Knee flexion    Knee extension    Ankle dorsiflexion -15; tight 0  Ankle plantarflexion 30; tight  54  Ankle inversion 8 24  Ankle eversion 3 21   (Blank rows = not tested)  LOWER EXTREMITY MMT: not tested due to surgical condition  FUNCTIONAL TESTS:  Unable to be assessed due to limited weight bearing through RLE  GAIT: Assistive device utilized:  knee scooter Level of assistance: Modified independence Comments: non weight bearing on RLE   TODAY'S TREATMENT:                                                                                                                              DATE:                                     08/10/23 EXERCISE LOG  Exercise Repetitions and Resistance Comments  Rocker board  3 minutes each DF/PF and inversion/eversion  Ankle circles  20 reps each  CW and CCW   Standing gastroc stretch  4 x 30 seconds   Seated gastroc stretch  2 x 30 seconds  For HEP  R resisted plantar flexion Red t-band x 30 reps        Blank cell = exercise not performed today                07/28/23                     EXERCISE LOG  Exercise Repetitions and Resistance Comments  Rockerboard Forward/backward & left/right x 3 mins each   Dynadisc Flex/ext; CW and CCW circles x 2 mins each   Towel Crunches 2 mins   Towel Swipes 2 mins   Marbles 12 marbles x 3 reps    Blank cell = exercise not performed today   Modalities  Date:  Vaso: Ankle, 34 degrees; low pressure, 15 mins, Pain and Edema   PATIENT EDUCATION:  Education details: healing, HEP, x-ray results, and expectation for soreness Person educated: Patient Education method: Explanation Education comprehension: verbalized understanding  HOME EXERCISE PROGRAM:   ASSESSMENT:  CLINICAL IMPRESSION: Treatment focused on improved right ankle range of motion through the use of new and familiar interventions. She required minimal cueing with today's new  interventions for proper exercise  performance. She was significantly educated on the expectation for healing following her injury and her recent x-ray results. She reported understanding.  She experienced no significant increase in her familiar symptoms with any of today's interventions. She continues to require skilled physical therapy to address her remaining impairments to maximize her functional mobility.    OBJECTIVE IMPAIRMENTS: Abnormal gait, decreased activity tolerance, decreased balance, decreased mobility, difficulty walking, decreased ROM, decreased strength, hypomobility, increased edema, impaired sensation, impaired tone, and pain.   ACTIVITY LIMITATIONS: carrying, lifting, standing, squatting, stairs, transfers, bathing, dressing, and locomotion level  PARTICIPATION LIMITATIONS: meal prep, cleaning, laundry, driving, shopping, community activity, and occupation  PERSONAL FACTORS: Past/current experiences, Time since onset of injury/illness/exacerbation, Transportation, and 3+ comorbidities: Allergies, current smoker, and history of cancer  are also affecting patient's functional outcome.   REHAB POTENTIAL: Good  CLINICAL DECISION MAKING: Evolving/moderate complexity  EVALUATION COMPLEXITY: Moderate   GOALS: Goals reviewed with patient? Yes  SHORT TERM GOALS: Target date: 08/12/23 Patient will be independent with her initial HEP. Baseline: Goal status: INITIAL  2.  Patient will be able to complete her daily activities without her familiar pain exceeding 6/10. Baseline:  Goal status: INITIAL  3.  Patient will be able to exhibit 100% weightbearing through her right lower extremity for improved mobility. Baseline:  Goal status: INITIAL  4.  Patient will improve her right ankle dorsiflexion to within 8 degrees of neutral for improved ankle mobility. Baseline:  Goal status: INITIAL  LONG TERM GOALS: Target date: 09/09/23  Patient will be independent with her advanced HEP. Baseline:  Goal status:  INITIAL  2.  Patient will be able to ambulate at least 80 feet without an assistive device for improved household mobility. Baseline:  Goal status: INITIAL  3.  Patient will be able to safely navigate at least 4 steps with a reciprocal pattern for improved household mobility. Baseline:  Goal status: INITIAL  4.  Patient will improve her right ankle dorsiflexion to neutral or greater for improved gait mechanics. Baseline:  Goal status: INITIAL  5.  Patient will be able to squat with no significant deviations for improved function picking up items from her floor. Baseline:  Goal status: INITIAL  PLAN:  PT FREQUENCY: 2x/week  PT DURATION: 8 weeks  PLANNED INTERVENTIONS: 97164- PT Re-evaluation, 97110-Therapeutic exercises, 97530- Therapeutic activity, 97112- Neuromuscular re-education, 97535- Self Care, [id number]- Manual therapy, [id number]- Gait training, 97014- Electrical stimulation (unattended), 97016- Vasopneumatic device, 97034- Contrast bath, Patient/Family education, Balance training, Stair training, Joint mobilization, Cryotherapy, and Moist heat  PLAN FOR NEXT SESSION: NuStep, standing weight shift, desensitization, active and passive ankle range of motion, and modalities as needed  Lacinda JAYSON Fass, PT 08/10/2023, 3:58 PM

## 2023-08-11 ENCOUNTER — Encounter: Payer: Self-pay | Admitting: Orthopaedic Surgery

## 2023-08-14 ENCOUNTER — Encounter: Payer: Self-pay | Admitting: Orthopaedic Surgery

## 2023-08-14 ENCOUNTER — Other Ambulatory Visit: Payer: Self-pay | Admitting: Orthopaedic Surgery

## 2023-08-14 MED ORDER — PREDNISONE 10 MG (21) PO TBPK: ORAL_TABLET | ORAL | 3 refills | Status: DC

## 2023-08-14 MED ORDER — GABAPENTIN 100 MG PO CAPS: 100.0000 mg | ORAL_CAPSULE | Freq: Every evening | ORAL | 3 refills | Status: AC | PRN

## 2023-08-14 MED ORDER — METHOCARBAMOL 750 MG PO TABS: 750.0000 mg | ORAL_TABLET | Freq: Two times a day (BID) | ORAL | 3 refills | Status: AC | PRN

## 2023-08-17 ENCOUNTER — Ambulatory Visit: Payer: Medicaid Other

## 2023-08-17 DIAGNOSIS — M25671 Stiffness of right ankle, not elsewhere classified: Secondary | ICD-10-CM

## 2023-08-17 DIAGNOSIS — R600 Localized edema: Secondary | ICD-10-CM

## 2023-08-17 DIAGNOSIS — M25571 Pain in right ankle and joints of right foot: Secondary | ICD-10-CM

## 2023-08-17 NOTE — Therapy (Signed)
OUTPATIENT PHYSICAL THERAPY LOWER EXTREMITY TREATMENT   Patient Name: Debbie Delgado MRN: [id number] DOB:Sep 26, 1970, 53 y.o., female Today's Date: 08/17/2023  END OF SESSION:  PT End of Session - 08/17/23 1605     Visit Number 4    Number of Visits 16    Date for PT Re-Evaluation 09/25/23    PT Start Time 1600    PT Stop Time 1645    PT Time Calculation (min) 45 min    Activity Tolerance Patient tolerated treatment well    Behavior During Therapy WFL for tasks assessed/performed               Past Medical History:  Diagnosis Date   Cancer (HCC)    cervical   Complication of anesthesia    "hard to wake" hard to put out   Ectopic pregnancy    Endometriosis    Fibroid    Past Surgical History:  Procedure Laterality Date   BREAST BIOPSY Right 12/08/2022   MM RT BREAST BX W LOC DEV 1ST LESION IMAGE BX SPEC STEREO GUIDE 12/08/2022 [facility]   BREAST BIOPSY  02/16/2023   MM RT RADIOACTIVE SEED LOC MAMMO GUIDE 02/16/2023 [facility]   BREAST LUMPECTOMY WITH RADIOACTIVE SEED LOCALIZATION Right 02/17/2023   Procedure: RIGHT BREAST LUMPECTOMY WITH RADIOACTIVE SEED LOCALIZATION;  Surgeon: Harriette Bouillon, MD;  Location: [facility];  Service: General;  Laterality: Right;   CARPAL TUNNEL RELEASE     ORIF ANKLE FRACTURE Right 05/06/2023   Procedure: OPEN REDUCTION INTERNAL FIXATION (ORIF) RIGHT TRIMALLEOLAR FRACTURE;  Surgeon: Tarry Kos, MD;  Location: [facility];  Service: Orthopedics;  Laterality: Right;  block   TONSILLECTOMY     Patient Active Problem List   Diagnosis Date Noted   Closed displaced trimalleolar fracture of right lower leg 04/25/2023   Anemia 06/05/2017   Abnormal uterine bleeding (AUB) 06/05/2017   REFERRING PROVIDER: Tarry Kos, MD   REFERRING DIAG: Closed displaced trimalleolar fracture of right ankle with routine healing, subsequent encounter   THERAPY DIAG:  Stiffness of right ankle, not elsewhere  classified  Pain in right ankle and joints of right foot  Localized edema  Rationale for Evaluation and Treatment: Rehabilitation  ONSET DATE: 04/24/23 (fracture) and 05/06/23 (surgery)  SUBJECTIVE:   SUBJECTIVE STATEMENT: Patient reports that she can go up and down stairs now, but it is still tight. She is no longer wearing her brace and she is able to drive now.   PERTINENT HISTORY: Allergies, current smoker, and history of cancer PAIN:  Are you having pain? Yes: NPRS scale: 5-6/10 Pain location: right ankle Pain description: constant soreness and burning Aggravating factors: weight bearing Relieving factors: ice and medication  PRECAUTIONS: None  RED FLAGS: None   WEIGHT BEARING RESTRICTIONS: Yes ; patient was told to begin WBAT  FALLS:  Has patient fallen in last 6 months? No  LIVING ENVIRONMENT: Lives with: lives alone Lives in: Mobile home Stairs: Yes: External: 4 steps; "yes, but they are not very sturdy" Has following equipment at home: shower chair and knee scooter  OCCUPATION: medical claims; works from home  PLOF: Independent  PATIENT GOALS: walk normally, navigate her steps, and be able to do her job without being limited by her pain  NEXT MD VISIT: none scheduled  OBJECTIVE:  Note: Objective measures were completed at Evaluation unless otherwise noted.  PATIENT SURVEYS:  FOTO 42.35 on 08/17/23  COGNITION: Overall cognitive status: Within functional limits for tasks assessed  SENSATION: Patient reports numbness and tingling around the incisions of her right ankle.  EDEMA:  Figure 8: L: 49.7 cm R: 52.4 cm   PALPATION:  Significant tenderness to palpation throughout her right foot and ankle  LOWER EXTREMITY ROM:  Active ROM Right eval Left eval  Hip flexion    Hip extension    Hip abduction    Hip adduction    Hip internal rotation    Hip external rotation    Knee flexion    Knee extension    Ankle dorsiflexion -15; tight 0   Ankle plantarflexion 30; tight  54  Ankle inversion 8 24  Ankle eversion 3 21   (Blank rows = not tested)  LOWER EXTREMITY MMT: not tested due to surgical condition  FUNCTIONAL TESTS:  Unable to be assessed due to limited weight bearing through RLE  GAIT: Assistive device utilized:  knee scooter Level of assistance: Modified independence Comments: non weight bearing on RLE   TODAY'S TREATMENT:                                                                                                                              DATE:                                     08/17/23 EXERCISE LOG  Exercise Repetitions and Resistance Comments  Rocker board (standing)  4 minutes each DF/PF and inversion/ eversion  Dynadisc 3 minutes  CW and CCW   Marching on foam  2 minutes BUE support  Standing gastroc stretch  4 x 30 seconds   Lateral step up  6" step x 3.5 minutes  Alternating LE  Standing soleus stretch  4 x 30 seconds    Blank cell = exercise not performed today  Modalities: no redness or adverse reaction to today's modalities  Date:  Vaso: Ankle, 34 degrees; low pressure, 15 mins, Pain and Edema                                   08/10/23 EXERCISE LOG  Exercise Repetitions and Resistance Comments  Rocker board  3 minutes each DF/PF and inversion/eversion  Ankle circles  20 reps each  CW and CCW   Standing gastroc stretch  4 x 30 seconds   Seated gastroc stretch  2 x 30 seconds  For HEP  R resisted plantar flexion Red t-band x 30 reps        Blank cell = exercise not performed today                07/28/23                     EXERCISE LOG  Exercise Repetitions and Resistance Comments  Rockerboard Forward/backward & left/right x 3 mins  each   Dynadisc Flex/ext; CW and CCW circles x 2 mins each   Towel Crunches 2 mins   Towel Swipes 2 mins   Marbles 12 marbles x 3 reps    Blank cell = exercise not performed today   Modalities  Date:  Vaso: Ankle, 34 degrees; low pressure, 15  mins, Pain and Edema   PATIENT EDUCATION:  Education details: healing, HEP, x-ray results, and expectation for soreness Person educated: Patient Education method: Explanation Education comprehension: verbalized understanding  HOME EXERCISE PROGRAM:   ASSESSMENT:  CLINICAL IMPRESSION: Patient was introduced to multiple new interventions for improved right ankle mobility and stability. She required minimal cueing with today's new interventions for proper exercise performance. She experienced no significant increase in her familiar symptoms with any of today's interventions. She reported feeling better upon the conclusion of treatment. She continues to require skilled physical therapy to address her remaining impairments to return to her prior level of function.   OBJECTIVE IMPAIRMENTS: Abnormal gait, decreased activity tolerance, decreased balance, decreased mobility, difficulty walking, decreased ROM, decreased strength, hypomobility, increased edema, impaired sensation, impaired tone, and pain.   ACTIVITY LIMITATIONS: carrying, lifting, standing, squatting, stairs, transfers, bathing, dressing, and locomotion level  PARTICIPATION LIMITATIONS: meal prep, cleaning, laundry, driving, shopping, community activity, and occupation  PERSONAL FACTORS: Past/current experiences, Time since onset of injury/illness/exacerbation, Transportation, and 3+ comorbidities: Allergies, current smoker, and history of cancer  are also affecting patient's functional outcome.   REHAB POTENTIAL: Good  CLINICAL DECISION MAKING: Evolving/moderate complexity  EVALUATION COMPLEXITY: Moderate   GOALS: Goals reviewed with patient? Yes  SHORT TERM GOALS: Target date: 08/12/23 Patient will be independent with her initial HEP. Baseline: Goal status: INITIAL  2.  Patient will be able to complete her daily activities without her familiar pain exceeding 6/10. Baseline:  Goal status: INITIAL  3.  Patient will be  able to exhibit 100% weightbearing through her right lower extremity for improved mobility. Baseline:  Goal status: INITIAL  4.  Patient will improve her right ankle dorsiflexion to within 8 degrees of neutral for improved ankle mobility. Baseline:  Goal status: INITIAL  LONG TERM GOALS: Target date: 09/09/23  Patient will be independent with her advanced HEP. Baseline:  Goal status: INITIAL  2.  Patient will be able to ambulate at least 80 feet without an assistive device for improved household mobility. Baseline:  Goal status: INITIAL  3.  Patient will be able to safely navigate at least 4 steps with a reciprocal pattern for improved household mobility. Baseline:  Goal status: INITIAL  4.  Patient will improve her right ankle dorsiflexion to neutral or greater for improved gait mechanics. Baseline:  Goal status: INITIAL  5.  Patient will be able to squat with no significant deviations for improved function picking up items from her floor. Baseline:  Goal status: INITIAL  PLAN:  PT FREQUENCY: 2x/week  PT DURATION: 8 weeks  PLANNED INTERVENTIONS: 97164- PT Re-evaluation, 97110-Therapeutic exercises, 97530- Therapeutic activity, 97112- Neuromuscular re-education, 97535- Self Care, 09811- Manual therapy, [id number]- Gait training, 97014- Electrical stimulation (unattended), 97016- Vasopneumatic device, 97034- Contrast bath, Patient/Family education, Balance training, Stair training, Joint mobilization, Cryotherapy, and Moist heat  PLAN FOR NEXT SESSION: NuStep, standing weight shift, desensitization, active and passive ankle range of motion, and modalities as needed  Granville Lewis, PT 08/17/2023, 4:50 PM

## 2023-08-19 ENCOUNTER — Other Ambulatory Visit: Payer: Self-pay | Admitting: Physician Assistant

## 2023-08-19 ENCOUNTER — Encounter: Payer: Self-pay | Admitting: Allergy & Immunology

## 2023-08-19 ENCOUNTER — Encounter: Payer: Self-pay | Admitting: Orthopaedic Surgery

## 2023-08-19 MED ORDER — TRAMADOL HCL 50 MG PO TABS: 50.0000 mg | ORAL_TABLET | Freq: Two times a day (BID) | ORAL | 0 refills | Status: DC | PRN

## 2023-08-19 NOTE — Telephone Encounter (Signed)
Sent in tramadol.  Cannot send in anything stronger

## 2023-08-19 NOTE — Telephone Encounter (Signed)
If she cannot take, I would recommend rest, ice, compression and elevation.  Cannot send in anything stronger and she is already on anything else I could put her on.  If her pain is that bad, she can come in and be seen

## 2023-08-20 ENCOUNTER — Encounter: Payer: Self-pay | Admitting: Orthopaedic Surgery

## 2023-08-20 ENCOUNTER — Ambulatory Visit: Payer: Medicaid Other

## 2023-08-20 DIAGNOSIS — M25671 Stiffness of right ankle, not elsewhere classified: Secondary | ICD-10-CM

## 2023-08-20 DIAGNOSIS — R600 Localized edema: Secondary | ICD-10-CM

## 2023-08-20 DIAGNOSIS — M25571 Pain in right ankle and joints of right foot: Secondary | ICD-10-CM

## 2023-08-20 NOTE — Therapy (Signed)
OUTPATIENT PHYSICAL THERAPY LOWER EXTREMITY TREATMENT   Patient Name: Debbie Delgado MRN: [id number] DOB:06-16-1971, 53 y.o., female Today's Date: 08/20/2023  END OF SESSION:  PT End of Session - 08/20/23 1619     Visit Number 5    Number of Visits 16    Date for PT Re-Evaluation 09/25/23    PT Start Time 1600    PT Stop Time 1642    PT Time Calculation (min) 42 min    Activity Tolerance Patient tolerated treatment well    Behavior During Therapy WFL for tasks assessed/performed                Past Medical History:  Diagnosis Date   Cancer (HCC)    cervical   Complication of anesthesia    "hard to wake" hard to put out   Ectopic pregnancy    Endometriosis    Fibroid    Past Surgical History:  Procedure Laterality Date   BREAST BIOPSY Right 12/08/2022   MM RT BREAST BX W LOC DEV 1ST LESION IMAGE BX SPEC STEREO GUIDE 12/08/2022 [facility]   BREAST BIOPSY  02/16/2023   MM RT RADIOACTIVE SEED LOC MAMMO GUIDE 02/16/2023 [facility]   BREAST LUMPECTOMY WITH RADIOACTIVE SEED LOCALIZATION Right 02/17/2023   Procedure: RIGHT BREAST LUMPECTOMY WITH RADIOACTIVE SEED LOCALIZATION;  Surgeon: Harriette Bouillon, MD;  Location: [facility];  Service: General;  Laterality: Right;   CARPAL TUNNEL RELEASE     ORIF ANKLE FRACTURE Right 05/06/2023   Procedure: OPEN REDUCTION INTERNAL FIXATION (ORIF) RIGHT TRIMALLEOLAR FRACTURE;  Surgeon: Tarry Kos, MD;  Location: [facility];  Service: Orthopedics;  Laterality: Right;  block   TONSILLECTOMY     Patient Active Problem List   Diagnosis Date Noted   Closed displaced trimalleolar fracture of right lower leg 04/25/2023   Anemia 06/05/2017   Abnormal uterine bleeding (AUB) 06/05/2017   REFERRING PROVIDER: Tarry Kos, MD   REFERRING DIAG: Closed displaced trimalleolar fracture of right ankle with routine healing, subsequent encounter   THERAPY DIAG:  Stiffness of right ankle, not elsewhere  classified  Pain in right ankle and joints of right foot  Localized edema  Rationale for Evaluation and Treatment: Rehabilitation  ONSET DATE: 04/24/23 (fracture) and 05/06/23 (surgery)  SUBJECTIVE:   SUBJECTIVE STATEMENT: Patient reports that her ankle is hurting a little bit today due to the exercises that she has been doing at home.   PERTINENT HISTORY: Allergies, current smoker, and history of cancer PAIN:  Are you having pain? Yes: NPRS scale: 5-6/10 Pain location: right ankle Pain description: constant soreness and burning Aggravating factors: weight bearing Relieving factors: ice and medication  PRECAUTIONS: None  RED FLAGS: None   WEIGHT BEARING RESTRICTIONS: Yes ; patient was told to begin WBAT  FALLS:  Has patient fallen in last 6 months? No  LIVING ENVIRONMENT: Lives with: lives alone Lives in: Mobile home Stairs: Yes: External: 4 steps; "yes, but they are not very sturdy" Has following equipment at home: shower chair and knee scooter  OCCUPATION: medical claims; works from home  PLOF: Independent  PATIENT GOALS: walk normally, navigate her steps, and be able to do her job without being limited by her pain  NEXT MD VISIT: none scheduled  OBJECTIVE:  Note: Objective measures were completed at Evaluation unless otherwise noted.  PATIENT SURVEYS:  FOTO 42.35 on 08/17/23  COGNITION: Overall cognitive status: Within functional limits for tasks assessed     SENSATION: Patient reports  numbness and tingling around the incisions of her right ankle.  EDEMA:  Figure 8: L: 49.7 cm R: 52.4 cm   PALPATION:  Significant tenderness to palpation throughout her right foot and ankle  LOWER EXTREMITY ROM:  Active ROM Right eval Left eval  Hip flexion    Hip extension    Hip abduction    Hip adduction    Hip internal rotation    Hip external rotation    Knee flexion    Knee extension    Ankle dorsiflexion -15; tight 0  Ankle plantarflexion 30; tight   54  Ankle inversion 8 24  Ankle eversion 3 21   (Blank rows = not tested)  LOWER EXTREMITY MMT: not tested due to surgical condition  FUNCTIONAL TESTS:  Unable to be assessed due to limited weight bearing through RLE  GAIT: Assistive device utilized:  knee scooter Level of assistance: Modified independence Comments: non weight bearing on RLE   TODAY'S TREATMENT:                                                                                                                              DATE:                                     08/20/23 EXERCISE LOG  Exercise Repetitions and Resistance Comments  Nustep  L5 x 10 minutes   Rocker board 4 minutes    Lunges onto step  6" step x 1.5 minutes each  For gastroc/soleus mobility   Narrow BOS on foam  2 minutes  Intermittent UE support   Marching on foam  3 minutes  BUE support   Blank cell = exercise not performed today  Modalities  Date:  Vaso: Ankle, 34 degrees; low pressure, 10 mins, Pain and Edema                                   08/17/23 EXERCISE LOG  Exercise Repetitions and Resistance Comments  Rocker board (standing)  4 minutes each DF/PF and inversion/ eversion  Dynadisc 3 minutes  CW and CCW   Marching on foam  2 minutes BUE support  Standing gastroc stretch  4 x 30 seconds   Lateral step up  6" step x 3.5 minutes  Alternating LE  Standing soleus stretch  4 x 30 seconds    Blank cell = exercise not performed today  Modalities: no redness or adverse reaction to today's modalities  Date:  Vaso: Ankle, 34 degrees; low pressure, 15 mins, Pain and Edema                                   08/10/23 EXERCISE LOG  Exercise Repetitions and Resistance Comments  Rocker board  3 minutes each DF/PF and inversion/eversion  Ankle circles  20 reps each  CW and CCW   Standing gastroc stretch  4 x 30 seconds   Seated gastroc stretch  2 x 30 seconds  For HEP  R resisted plantar flexion Red t-band x 30 reps        Blank cell = exercise  not performed today   PATIENT EDUCATION:  Education details: healing, HEP, x-ray results, and expectation for soreness Person educated: Patient Education method: Explanation Education comprehension: verbalized understanding  HOME EXERCISE PROGRAM:   ASSESSMENT:  CLINICAL IMPRESSION: Patient was introduced to multiple new interventions for improved ankle stability through the use os static and dynamic interventions. She required minimal cueing with today's new interventions for proper exercise performance. She experienced a moderate increase in her familiar symptoms with today's interventions, but this was able to be reduced with modalities upon the conclusion of today's active interventions. She reported that her ankle felt sore upon the conclusion of treatment. She continues to require skilled physical therapy to address her remaining impairments to return to her prior level of function.   OBJECTIVE IMPAIRMENTS: Abnormal gait, decreased activity tolerance, decreased balance, decreased mobility, difficulty walking, decreased ROM, decreased strength, hypomobility, increased edema, impaired sensation, impaired tone, and pain.   ACTIVITY LIMITATIONS: carrying, lifting, standing, squatting, stairs, transfers, bathing, dressing, and locomotion level  PARTICIPATION LIMITATIONS: meal prep, cleaning, laundry, driving, shopping, community activity, and occupation  PERSONAL FACTORS: Past/current experiences, Time since onset of injury/illness/exacerbation, Transportation, and 3+ comorbidities: Allergies, current smoker, and history of cancer  are also affecting patient's functional outcome.   REHAB POTENTIAL: Good  CLINICAL DECISION MAKING: Evolving/moderate complexity  EVALUATION COMPLEXITY: Moderate   GOALS: Goals reviewed with patient? Yes  SHORT TERM GOALS: Target date: 08/12/23 Patient will be independent with her initial HEP. Baseline: Goal status: INITIAL  2.  Patient will be able  to complete her daily activities without her familiar pain exceeding 6/10. Baseline:  Goal status: INITIAL  3.  Patient will be able to exhibit 100% weightbearing through her right lower extremity for improved mobility. Baseline:  Goal status: INITIAL  4.  Patient will improve her right ankle dorsiflexion to within 8 degrees of neutral for improved ankle mobility. Baseline:  Goal status: INITIAL  LONG TERM GOALS: Target date: 09/09/23  Patient will be independent with her advanced HEP. Baseline:  Goal status: INITIAL  2.  Patient will be able to ambulate at least 80 feet without an assistive device for improved household mobility. Baseline:  Goal status: INITIAL  3.  Patient will be able to safely navigate at least 4 steps with a reciprocal pattern for improved household mobility. Baseline:  Goal status: INITIAL  4.  Patient will improve her right ankle dorsiflexion to neutral or greater for improved gait mechanics. Baseline:  Goal status: INITIAL  5.  Patient will be able to squat with no significant deviations for improved function picking up items from her floor. Baseline:  Goal status: INITIAL  PLAN:  PT FREQUENCY: 2x/week  PT DURATION: 8 weeks  PLANNED INTERVENTIONS: 97164- PT Re-evaluation, 97110-Therapeutic exercises, 97530- Therapeutic activity, 97112- Neuromuscular re-education, 97535- Self Care, 16109- Manual therapy, [id number]- Gait training, 97014- Electrical stimulation (unattended), 97016- Vasopneumatic device, 97034- Contrast bath, Patient/Family education, Balance training, Stair training, Joint mobilization, Cryotherapy, and Moist heat  PLAN FOR NEXT SESSION: NuStep, standing weight shift, desensitization, active and passive ankle range of motion, and modalities as needed  Granville Lewis, PT 08/20/2023,  5:30 PM

## 2023-08-21 ENCOUNTER — Other Ambulatory Visit: Payer: Self-pay | Admitting: Physician Assistant

## 2023-08-21 NOTE — Telephone Encounter (Signed)
There are 3 refills on the bottle

## 2023-08-21 NOTE — Telephone Encounter (Signed)
Can we see if Debbie Delgado can see her for her back?

## 2023-08-24 ENCOUNTER — Ambulatory Visit: Payer: Medicaid Other

## 2023-08-24 DIAGNOSIS — M25671 Stiffness of right ankle, not elsewhere classified: Secondary | ICD-10-CM

## 2023-08-24 DIAGNOSIS — R600 Localized edema: Secondary | ICD-10-CM

## 2023-08-24 DIAGNOSIS — M25571 Pain in right ankle and joints of right foot: Secondary | ICD-10-CM

## 2023-08-24 NOTE — Therapy (Signed)
OUTPATIENT PHYSICAL THERAPY LOWER EXTREMITY TREATMENT   Patient Name: Debbie Delgado MRN: [id number] DOB:Jun 03, 1971, 53 y.o., female Today's Date: 08/24/2023  END OF SESSION:  PT End of Session - 08/24/23 1525     Visit Number 6    Number of Visits 16    Date for PT Re-Evaluation 09/25/23    PT Start Time 1525    PT Stop Time 1610    PT Time Calculation (min) 45 min    Activity Tolerance Patient tolerated treatment well    Behavior During Therapy WFL for tasks assessed/performed                Past Medical History:  Diagnosis Date   Cancer (HCC)    cervical   Complication of anesthesia    "hard to wake" hard to put out   Ectopic pregnancy    Endometriosis    Fibroid    Past Surgical History:  Procedure Laterality Date   BREAST BIOPSY Right 12/08/2022   MM RT BREAST BX W LOC DEV 1ST LESION IMAGE BX SPEC STEREO GUIDE 12/08/2022 [facility]   BREAST BIOPSY  02/16/2023   MM RT RADIOACTIVE SEED LOC MAMMO GUIDE 02/16/2023 [facility]   BREAST LUMPECTOMY WITH RADIOACTIVE SEED LOCALIZATION Right 02/17/2023   Procedure: RIGHT BREAST LUMPECTOMY WITH RADIOACTIVE SEED LOCALIZATION;  Surgeon: Harriette Bouillon, MD;  Location: [facility];  Service: General;  Laterality: Right;   CARPAL TUNNEL RELEASE     ORIF ANKLE FRACTURE Right 05/06/2023   Procedure: OPEN REDUCTION INTERNAL FIXATION (ORIF) RIGHT TRIMALLEOLAR FRACTURE;  Surgeon: Tarry Kos, MD;  Location: [facility];  Service: Orthopedics;  Laterality: Right;  block   TONSILLECTOMY     Patient Active Problem List   Diagnosis Date Noted   Closed displaced trimalleolar fracture of right lower leg 04/25/2023   Anemia 06/05/2017   Abnormal uterine bleeding (AUB) 06/05/2017   REFERRING PROVIDER: Tarry Kos, MD   REFERRING DIAG: Closed displaced trimalleolar fracture of right ankle with routine healing, subsequent encounter   THERAPY DIAG:  Stiffness of right ankle, not elsewhere  classified  Pain in right ankle and joints of right foot  Localized edema  Rationale for Evaluation and Treatment: Rehabilitation  ONSET DATE: 04/24/23 (fracture) and 05/06/23 (surgery)  SUBJECTIVE:   SUBJECTIVE STATEMENT: Patient reports that her ankle is hurting a little bit today due to the exercises that she has been doing at home.   PERTINENT HISTORY: Allergies, current smoker, and history of cancer PAIN:  Are you having pain? Yes: NPRS scale: 5-6/10 Pain location: right ankle Pain description: constant soreness and burning Aggravating factors: weight bearing Relieving factors: ice and medication  PRECAUTIONS: None  RED FLAGS: None   WEIGHT BEARING RESTRICTIONS: Yes ; patient was told to begin WBAT  FALLS:  Has patient fallen in last 6 months? No  LIVING ENVIRONMENT: Lives with: lives alone Lives in: Mobile home Stairs: Yes: External: 4 steps; "yes, but they are not very sturdy" Has following equipment at home: shower chair and knee scooter  OCCUPATION: medical claims; works from home  PLOF: Independent  PATIENT GOALS: walk normally, navigate her steps, and be able to do her job without being limited by her pain  NEXT MD VISIT: none scheduled  OBJECTIVE:  Note: Objective measures were completed at Evaluation unless otherwise noted.  PATIENT SURVEYS:  FOTO 42.35 on 08/17/23  COGNITION: Overall cognitive status: Within functional limits for tasks assessed     SENSATION: Patient reports  numbness and tingling around the incisions of her right ankle.  EDEMA:  Figure 8: L: 49.7 cm R: 52.4 cm   PALPATION:  Significant tenderness to palpation throughout her right foot and ankle  LOWER EXTREMITY ROM:  Active ROM Right eval Left eval  Hip flexion    Hip extension    Hip abduction    Hip adduction    Hip internal rotation    Hip external rotation    Knee flexion    Knee extension    Ankle dorsiflexion -15; tight 0  Ankle plantarflexion 30; tight   54  Ankle inversion 8 24  Ankle eversion 3 21   (Blank rows = not tested)  LOWER EXTREMITY MMT: not tested due to surgical condition  FUNCTIONAL TESTS:  Unable to be assessed due to limited weight bearing through RLE  GAIT: Assistive device utilized:  knee scooter Level of assistance: Modified independence Comments: non weight bearing on RLE   TODAY'S TREATMENT:                                                                                                                              DATE:                                     08/24/23 EXERCISE LOG  Exercise Repetitions and Resistance Comments  Nustep  L5 x 15 minutes   Rocker board 3 mins forward/backward and 3 mins left/right   Lunges onto step  6" step x 4 minutes each  For gastroc/soleus mobility   Narrow BOS on foam   Intermittent UE support   Marching on foam   BUE support   Blank cell = exercise not performed today  Modalities  Date:  Vaso: Ankle, 34 degrees; low pressure, 10 mins, Pain and Edema                                   08/17/23 EXERCISE LOG  Exercise Repetitions and Resistance Comments  Rocker board (standing)  4 minutes each DF/PF and inversion/ eversion  Dynadisc 3 minutes  CW and CCW   Marching on foam  2 minutes BUE support  Standing gastroc stretch  4 x 30 seconds   Lateral step up  6" step x 3.5 minutes  Alternating LE  Standing soleus stretch  4 x 30 seconds    Blank cell = exercise not performed today  Modalities: no redness or adverse reaction to today's modalities  Date:  Vaso: Ankle, 34 degrees; low pressure, 15 mins, Pain and Edema                                   08/10/23 EXERCISE LOG  Exercise Repetitions and Resistance Comments  Rocker board  3 minutes each DF/PF and inversion/eversion  Ankle circles  20 reps each  CW and CCW   Standing gastroc stretch  4 x 30 seconds   Seated gastroc stretch  2 x 30 seconds  For HEP  R resisted plantar flexion Red t-band x 30 reps        Blank  cell = exercise not performed today   PATIENT EDUCATION:  Education details: healing, HEP, x-ray results, and expectation for soreness Person educated: Patient Education method: Explanation Education comprehension: verbalized understanding  HOME EXERCISE PROGRAM:   ASSESSMENT:  CLINICAL IMPRESSION: Pt arrives for today's treatment session reporting 5/10 right ankle pain.  Pt able to demonstrate 33 degrees of right ankle plantarflexion and 4 degrees of right ankle dorsiflexion meeting her STG and LTG. Normal responses to vaso noted upon removal.  Pt reported minimal decrease in pain at completion of today's treatment session.  OBJECTIVE IMPAIRMENTS: Abnormal gait, decreased activity tolerance, decreased balance, decreased mobility, difficulty walking, decreased ROM, decreased strength, hypomobility, increased edema, impaired sensation, impaired tone, and pain.   ACTIVITY LIMITATIONS: carrying, lifting, standing, squatting, stairs, transfers, bathing, dressing, and locomotion level  PARTICIPATION LIMITATIONS: meal prep, cleaning, laundry, driving, shopping, community activity, and occupation  PERSONAL FACTORS: Past/current experiences, Time since onset of injury/illness/exacerbation, Transportation, and 3+ comorbidities: Allergies, current smoker, and history of cancer  are also affecting patient's functional outcome.   REHAB POTENTIAL: Good  CLINICAL DECISION MAKING: Evolving/moderate complexity  EVALUATION COMPLEXITY: Moderate   GOALS: Goals reviewed with patient? Yes  SHORT TERM GOALS: Target date: 08/12/23 Patient will be independent with her initial HEP. Baseline: Goal status: MET  2.  Patient will be able to complete her daily activities without her familiar pain exceeding 6/10. Baseline:  Goal status: IN PROGRESS  3.  Patient will be able to exhibit 100% weightbearing through her right lower extremity for improved mobility. Baseline:  Goal status: MET  4.  Patient  will improve her right ankle dorsiflexion to within 8 degrees of neutral for improved ankle mobility. Baseline:  Goal status: MET  LONG TERM GOALS: Target date: 09/09/23  Patient will be independent with her advanced HEP. Baseline:  Goal status: IN PROGRESS  2.  Patient will be able to ambulate at least 80 feet without an assistive device for improved household mobility. Baseline:  Goal status: MET  3.  Patient will be able to safely navigate at least 4 steps with a reciprocal pattern for improved household mobility. Baseline:  Goal status: IN PROGRESS  4.  Patient will improve her right ankle dorsiflexion to neutral or greater for improved gait mechanics. Baseline: 4 degrees Goal status: MET  5.  Patient will be able to squat with no significant deviations for improved function picking up items from her floor. Baseline:  Goal status: IN PROGRESS  PLAN:  PT FREQUENCY: 2x/week  PT DURATION: 8 weeks  PLANNED INTERVENTIONS: 97164- PT Re-evaluation, 97110-Therapeutic exercises, 97530- Therapeutic activity, 97112- Neuromuscular re-education, 97535- Self Care, 04540- Manual therapy, 97116- Gait training, 97014- Electrical stimulation (unattended), 97016- Vasopneumatic device, 97034- Contrast bath, Patient/Family education, Balance training, Stair training, Joint mobilization, Cryotherapy, and Moist heat  PLAN FOR NEXT SESSION: NuStep, standing weight shift, desensitization, active and passive ankle range of motion, and modalities as needed  Newman Pies, PTA 08/24/2023, 4:14 PM

## 2023-08-25 ENCOUNTER — Other Ambulatory Visit (INDEPENDENT_AMBULATORY_CARE_PROVIDER_SITE_OTHER): Payer: Self-pay

## 2023-08-25 ENCOUNTER — Encounter: Payer: Self-pay | Admitting: Orthopaedic Surgery

## 2023-08-25 ENCOUNTER — Ambulatory Visit (INDEPENDENT_AMBULATORY_CARE_PROVIDER_SITE_OTHER): Payer: Medicaid Other | Admitting: Orthopaedic Surgery

## 2023-08-25 DIAGNOSIS — M5441 Lumbago with sciatica, right side: Secondary | ICD-10-CM | POA: Diagnosis not present

## 2023-08-25 DIAGNOSIS — S82851D Displaced trimalleolar fracture of right lower leg, subsequent encounter for closed fracture with routine healing: Secondary | ICD-10-CM

## 2023-08-25 DIAGNOSIS — G8929 Other chronic pain: Secondary | ICD-10-CM | POA: Diagnosis not present

## 2023-08-25 MED ORDER — GABAPENTIN 300 MG PO CAPS: 300.0000 mg | ORAL_CAPSULE | Freq: Two times a day (BID) | ORAL | 3 refills | Status: AC

## 2023-08-25 MED ORDER — PREDNISONE 10 MG (21) PO TBPK: ORAL_TABLET | ORAL | 3 refills | Status: DC

## 2023-08-25 NOTE — Progress Notes (Signed)
Office Visit Note   Patient: Debbie Delgado           Date of Birth: 1970-12-01           MRN: [id number] Visit Date: 08/25/2023              Requested by: Orlene Plum, NP [address] [place],  [place] [address] PCP: Orlene Plum, NP   Assessment & Plan: Visit Diagnoses:  1. Closed displaced trimalleolar fracture of right ankle with routine healing, subsequent encounter   2. 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-sided sciatica     Plan: Debbie Delgado is a 53 year old female with lumbar radiculopathy and sciatica in the right leg.  Think due to her right ankle injury and the rehab that she has had to do this has exacerbated her back.  I will make a new referral to the [facility] office for physical therapy for the back and the ankle.  I refilled the gabapentin and the prednisone.  If her symptoms do not improve we will need to get an MRI.  Follow-Up Instructions: No follow-ups on file.   Orders:  Orders Placed This Encounter  Procedures   XR Lumbar Spine 2-3 Views   Ambulatory referral to Physical Therapy   Meds ordered this encounter  Medications   gabapentin (NEURONTIN) 300 MG capsule    Sig: Take 1 capsule (300 mg total) by mouth 2 (two) times daily.    Dispense:  60 capsule    Refill:  3   predniSONE (STERAPRED UNI-PAK 21 TAB) 10 MG (21) TBPK tablet    Sig: Take as directed    Dispense:  21 tablet    Refill:  3      Procedures: No procedures performed   Clinical Data: No additional findings.   Subjective: Chief Complaint  Patient presents with   Right Ankle - Follow-up    ORIF trimall 05/06/2023   Lower Back - Pain    HPI Debbie Delgado follows up today for her ankle surgery and lumbar sciatica. Review of Systems  Constitutional: Negative.   HENT: Negative.    Eyes: Negative.   Respiratory: Negative.    Cardiovascular: Negative.   Endocrine: Negative.   Musculoskeletal: Negative.   Neurological: Negative.   Hematological: Negative.    Psychiatric/Behavioral: Negative.    All other systems reviewed and are negative.    Objective: Vital Signs: LMP 05/22/2017   Physical Exam Vitals and nursing note reviewed.  Constitutional:      Appearance: She is well-developed.  HENT:     Head: Normocephalic and atraumatic.  Pulmonary:     Effort: Pulmonary effort is normal.  Abdominal:     Palpations: Abdomen is soft.  Musculoskeletal:     Cervical back: Neck supple.  Skin:    General: Skin is warm.     Capillary Refill: Capillary refill takes less than 2 seconds.  Neurological:     Mental Status: She is alert and oriented to person, place, and time.  Psychiatric:        Behavior: Behavior normal.        Thought Content: Thought content normal.        Judgment: Judgment normal.     Ortho Exam Examination of the right ankle and right lower extremity and the lumbar spine are unchanged from prior visit. Specialty Comments:  No specialty comments available.  Imaging: XR Lumbar Spine 2-3 Views Result Date: 08/25/2023 X-rays of the lumbar spine show degenerative spurring and disc space  narrowing at L4-5.  No acute abnormalities.    PMFS History: Patient Active Problem List   Diagnosis Date Noted   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-sided sciatica 08/25/2023   Closed displaced trimalleolar fracture of right lower leg 04/25/2023   Anemia 06/05/2017   Abnormal uterine bleeding (AUB) 06/05/2017   Past Medical History:  Diagnosis Date   Cancer (HCC)    cervical   Complication of anesthesia    "hard to wake" hard to put out   Ectopic pregnancy    Endometriosis    Fibroid     Family History  Problem Relation Age of Onset   Allergic rhinitis Sister    Breast cancer Maternal Grandmother    Allergic rhinitis Brother    Angioedema Neg Hx    Asthma Neg Hx    Atopy Neg Hx    Eczema Neg Hx    Immunodeficiency Neg Hx    Urticaria Neg Hx     Past Surgical History:  Procedure Laterality Date    BREAST BIOPSY Right 12/08/2022   MM RT BREAST BX W LOC DEV 1ST LESION IMAGE BX SPEC STEREO GUIDE 12/08/2022 [facility]   BREAST BIOPSY  02/16/2023   MM RT RADIOACTIVE SEED LOC MAMMO GUIDE 02/16/2023 [facility]   BREAST LUMPECTOMY WITH RADIOACTIVE SEED LOCALIZATION Right 02/17/2023   Procedure: RIGHT BREAST LUMPECTOMY WITH RADIOACTIVE SEED LOCALIZATION;  Surgeon: Harriette Bouillon, MD;  Location: [facility];  Service: General;  Laterality: Right;   CARPAL TUNNEL RELEASE     ORIF ANKLE FRACTURE Right 05/06/2023   Procedure: OPEN REDUCTION INTERNAL FIXATION (ORIF) RIGHT TRIMALLEOLAR FRACTURE;  Surgeon: Tarry Kos, MD;  Location: [facility];  Service: Orthopedics;  Laterality: Right;  block   TONSILLECTOMY     Social History   Occupational History   Not on file  Tobacco Use   Smoking status: Every Day    Current packs/day: 0.50    Types: Cigarettes   Smokeless tobacco: Never   Tobacco comments:    1/2 PPD  Substance and Sexual Activity   Alcohol use: Yes    Comment: occasionally   Drug use: No   Sexual activity: Not on file

## 2023-08-27 ENCOUNTER — Ambulatory Visit: Payer: Medicaid Other | Admitting: *Deleted

## 2023-08-27 ENCOUNTER — Encounter: Payer: Self-pay | Admitting: *Deleted

## 2023-08-27 DIAGNOSIS — R600 Localized edema: Secondary | ICD-10-CM

## 2023-08-27 DIAGNOSIS — M25571 Pain in right ankle and joints of right foot: Secondary | ICD-10-CM

## 2023-08-27 DIAGNOSIS — M25671 Stiffness of right ankle, not elsewhere classified: Secondary | ICD-10-CM | POA: Diagnosis not present

## 2023-08-27 NOTE — Therapy (Signed)
OUTPATIENT PHYSICAL THERAPY LOWER EXTREMITY TREATMENT   Patient Name: Debbie Delgado MRN: [id number] DOB:1970-09-15, 53 y.o., female Today's Date: 08/27/2023  END OF SESSION:  PT End of Session - 08/27/23 1532     Visit Number 7    Number of Visits 16    Date for PT Re-Evaluation 09/25/23    PT Start Time 1533                Past Medical History:  Diagnosis Date   Cancer (HCC)    cervical   Complication of anesthesia    "hard to wake" hard to put out   Ectopic pregnancy    Endometriosis    Fibroid    Past Surgical History:  Procedure Laterality Date   BREAST BIOPSY Right 12/08/2022   MM RT BREAST BX W LOC DEV 1ST LESION IMAGE BX SPEC STEREO GUIDE 12/08/2022 [facility]   BREAST BIOPSY  02/16/2023   MM RT RADIOACTIVE SEED LOC MAMMO GUIDE 02/16/2023 [facility]   BREAST LUMPECTOMY WITH RADIOACTIVE SEED LOCALIZATION Right 02/17/2023   Procedure: RIGHT BREAST LUMPECTOMY WITH RADIOACTIVE SEED LOCALIZATION;  Surgeon: Harriette Bouillon, MD;  Location: [facility];  Service: General;  Laterality: Right;   CARPAL TUNNEL RELEASE     ORIF ANKLE FRACTURE Right 05/06/2023   Procedure: OPEN REDUCTION INTERNAL FIXATION (ORIF) RIGHT TRIMALLEOLAR FRACTURE;  Surgeon: Tarry Kos, MD;  Location: [facility];  Service: Orthopedics;  Laterality: Right;  block   TONSILLECTOMY     Patient Active Problem List   Diagnosis Date Noted   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-sided sciatica 08/25/2023   Closed displaced trimalleolar fracture of right lower leg 04/25/2023   Anemia 06/05/2017   Abnormal uterine bleeding (AUB) 06/05/2017   REFERRING PROVIDER: Tarry Kos, MD   REFERRING DIAG: Closed displaced trimalleolar fracture of right ankle with routine healing, subsequent encounter   THERAPY DIAG:  Stiffness of right ankle, not elsewhere classified  Pain in right ankle and joints of right foot  Localized edema  Rationale for  Evaluation and Treatment: Rehabilitation  ONSET DATE: 04/24/23 (fracture) and 05/06/23 (surgery)  SUBJECTIVE:   SUBJECTIVE STATEMENT: Patient reports RT ankle pain 3/10. 15 mins late.MD pleased with ankle so far and wants me to start PT for LB  PERTINENT HISTORY: Allergies, current smoker, and history of cancer PAIN:  Are you having pain? Yes: NPRS scale: 3/10 Pain location: right ankle Pain description: constant soreness and burning Aggravating factors: weight bearing Relieving factors: ice and medication  PRECAUTIONS: None  RED FLAGS: None   WEIGHT BEARING RESTRICTIONS: Yes ; patient was told to begin WBAT  FALLS:  Has patient fallen in last 6 months? No  LIVING ENVIRONMENT: Lives with: lives alone Lives in: Mobile home Stairs: Yes: External: 4 steps; "yes, but they are not very sturdy" Has following equipment at home: shower chair and knee scooter  OCCUPATION: medical claims; works from home  PLOF: Independent  PATIENT GOALS: walk normally, navigate her steps, and be able to do her job without being limited by her pain  NEXT MD VISIT: none scheduled  OBJECTIVE:  Note: Objective measures were completed at Evaluation unless otherwise noted.  PATIENT SURVEYS:  FOTO 42.35 on 08/17/23  COGNITION: Overall cognitive status: Within functional limits for tasks assessed     SENSATION: Patient reports numbness and tingling around the incisions of her right ankle.  EDEMA:  Figure 8: L: 49.7 cm R: 52.4 cm   PALPATION:  Significant tenderness to palpation throughout her right foot and ankle  LOWER EXTREMITY ROM:  Active ROM Right eval Left eval  Hip flexion    Hip extension    Hip abduction    Hip adduction    Hip internal rotation    Hip external rotation    Knee flexion    Knee extension    Ankle dorsiflexion -15; tight 0  Ankle plantarflexion 30; tight  54  Ankle inversion 8 24  Ankle eversion 3 21   (Blank rows = not tested)  LOWER EXTREMITY MMT:  not tested due to surgical condition  FUNCTIONAL TESTS:  Unable to be assessed due to limited weight bearing through RLE  GAIT: Assistive device utilized:  knee scooter Level of assistance: Modified independence Comments: non weight bearing on RLE   TODAY'S TREATMENT:                                                                                                                              DATE:                                     08/27/23 EXERCISE LOG   RT ankle  Exercise Repetitions and Resistance Comments  Nustep  L7 x 11 minutes   Rocker board 4 mins forward/backward and left/right and balance    Lunges onto step  14" step x 20 For gastroc/soleus mobility   Narrow BOS on foam   Intermittent UE support   Marching on foam   BUE support   Blank cell = exercise not performed today  Modalities  Date:  Vaso: Ankle,  ,   mins, Pain and Edema                                   08/17/23 EXERCISE LOG  Exercise Repetitions and Resistance Comments  Rocker board (standing)  4 minutes each DF/PF and inversion/ eversion  Dynadisc 3 minutes  CW and CCW   Marching on foam  2 minutes BUE support  Standing gastroc stretch  4 x 30 seconds   Lateral step up  6" step x 3.5 minutes  Alternating LE  Standing soleus stretch  4 x 30 seconds    Blank cell = exercise not performed today  Modalities: no redness or adverse reaction to today's modalities  Date:  Vaso: Ankle, 34 degrees; low pressure, 15 mins, Pain and Edema                                   08/10/23 EXERCISE LOG  Exercise Repetitions and Resistance Comments  Rocker board  3 minutes each DF/PF and inversion/eversion  Ankle circles  20 reps each  CW and CCW   Standing  gastroc stretch  4 x 30 seconds   Seated gastroc stretch  2 x 30 seconds  For HEP  R resisted plantar flexion Red t-band x 30 reps        Blank cell = exercise not performed today   PATIENT EDUCATION:  Education details: healing, HEP, x-ray results, and  expectation for soreness Person educated: Patient Education method: Explanation Education comprehension: verbalized understanding  HOME EXERCISE PROGRAM:   ASSESSMENT:  CLINICAL IMPRESSION: Pt arrives for today's treatment session reporting 3/10 right ankle pain and MD pleased with status, but wants me to start LB PT.    Pt 15 mins late for PT , but was able to continue with RT ankle therex for ROM, strength, and prprioception for RT ankle. No vaso today as per Pt.  OBJECTIVE IMPAIRMENTS: Abnormal gait, decreased activity tolerance, decreased balance, decreased mobility, difficulty walking, decreased ROM, decreased strength, hypomobility, increased edema, impaired sensation, impaired tone, and pain.   ACTIVITY LIMITATIONS: carrying, lifting, standing, squatting, stairs, transfers, bathing, dressing, and locomotion level  PARTICIPATION LIMITATIONS: meal prep, cleaning, laundry, driving, shopping, community activity, and occupation  PERSONAL FACTORS: Past/current experiences, Time since onset of injury/illness/exacerbation, Transportation, and 3+ comorbidities: Allergies, current smoker, and history of cancer  are also affecting patient's functional outcome.   REHAB POTENTIAL: Good  CLINICAL DECISION MAKING: Evolving/moderate complexity  EVALUATION COMPLEXITY: Moderate   GOALS: Goals reviewed with patient? Yes  SHORT TERM GOALS: Target date: 08/12/23 Patient will be independent with her initial HEP. Baseline: Goal status: MET  2.  Patient will be able to complete her daily activities without her familiar pain exceeding 6/10. Baseline:  Goal status: IN PROGRESS  3.  Patient will be able to exhibit 100% weightbearing through her right lower extremity for improved mobility. Baseline:  Goal status: MET  4.  Patient will improve her right ankle dorsiflexion to within 8 degrees of neutral for improved ankle mobility. Baseline:  Goal status: MET  LONG TERM GOALS: Target date:  09/09/23  Patient will be independent with her advanced HEP. Baseline:  Goal status: IN PROGRESS  2.  Patient will be able to ambulate at least 80 feet without an assistive device for improved household mobility. Baseline:  Goal status: MET  3.  Patient will be able to safely navigate at least 4 steps with a reciprocal pattern for improved household mobility. Baseline:  Goal status: MET  4.  Patient will improve her right ankle dorsiflexion to neutral or greater for improved gait mechanics. Baseline: 4 degrees Goal status: MET  5.  Patient will be able to squat with no significant deviations for improved function picking up items from her floor. Baseline:  Goal status: IN PROGRESS  PLAN:  PT FREQUENCY: 2x/week  PT DURATION: 8 weeks  PLANNED INTERVENTIONS: 97164- PT Re-evaluation, 97110-Therapeutic exercises, 97530- Therapeutic activity, 97112- Neuromuscular re-education, 97535- Self Care, 60454- Manual therapy, 97116- Gait training, 97014- Electrical stimulation (unattended), 97016- Vasopneumatic device, 97034- Contrast bath, Patient/Family education, Balance training, Stair training, Joint mobilization, Cryotherapy, and Moist heat  PLAN FOR NEXT SESSION: NuStep, standing weight shift, desensitization, active and passive ankle range of motion, and modalities as needed  Kamir Selover,CHRIS, PTA 08/27/2023, 3:33 PM

## 2023-08-31 ENCOUNTER — Ambulatory Visit: Payer: Medicaid Other

## 2023-09-02 ENCOUNTER — Ambulatory Visit (INDEPENDENT_AMBULATORY_CARE_PROVIDER_SITE_OTHER): Payer: Self-pay

## 2023-09-02 DIAGNOSIS — J309 Allergic rhinitis, unspecified: Secondary | ICD-10-CM

## 2023-09-03 ENCOUNTER — Ambulatory Visit: Payer: Medicaid Other | Attending: Orthopaedic Surgery | Admitting: Physical Therapy

## 2023-09-03 DIAGNOSIS — M25571 Pain in right ankle and joints of right foot: Secondary | ICD-10-CM | POA: Diagnosis present

## 2023-09-03 DIAGNOSIS — M25671 Stiffness of right ankle, not elsewhere classified: Secondary | ICD-10-CM | POA: Insufficient documentation

## 2023-09-03 DIAGNOSIS — R600 Localized edema: Secondary | ICD-10-CM | POA: Diagnosis present

## 2023-09-03 NOTE — Therapy (Signed)
 OUTPATIENT PHYSICAL THERAPY LOWER EXTREMITY TREATMENT   Patient Name: Debbie Delgado MRN: [id number] DOB:1971-04-08, 53 y.o., female Today's Date: 09/03/2023  END OF SESSION:  PT End of Session - 09/03/23 1528     Visit Number 8    Number of Visits 16    Date for PT Re-Evaluation 09/25/23    PT Start Time 0319    PT Stop Time 0351    PT Time Calculation (min) 32 min    Activity Tolerance Patient tolerated treatment well    Behavior During Therapy [facility] for tasks assessed/performed                Past Medical History:  Diagnosis Date   Cancer (HCC)    cervical   Complication of anesthesia    hard to wake hard to put out   Ectopic pregnancy    Endometriosis    Fibroid    Past Surgical History:  Procedure Laterality Date   BREAST BIOPSY Right 12/08/2022   MM RT BREAST BX W LOC DEV 1ST LESION IMAGE BX SPEC STEREO GUIDE 12/08/2022 [facility]   BREAST BIOPSY  02/16/2023   MM RT RADIOACTIVE SEED LOC MAMMO GUIDE 02/16/2023 [facility]   BREAST LUMPECTOMY WITH RADIOACTIVE SEED LOCALIZATION Right 02/17/2023   Procedure: RIGHT BREAST LUMPECTOMY WITH RADIOACTIVE SEED LOCALIZATION;  Surgeon: Vanderbilt Ned, MD;  Location: [facility];  Service: General;  Laterality: Right;   CARPAL TUNNEL RELEASE     ORIF ANKLE FRACTURE Right 05/06/2023   Procedure: OPEN REDUCTION INTERNAL FIXATION (ORIF) RIGHT TRIMALLEOLAR FRACTURE;  Surgeon: Jerri Kay HERO, MD;  Location: [facility];  Service: Orthopedics;  Laterality: Right;  block   TONSILLECTOMY     Patient Active Problem List   Diagnosis Date Noted   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-sided sciatica 08/25/2023   Closed displaced trimalleolar fracture of right lower leg 04/25/2023   Anemia 06/05/2017   Abnormal uterine bleeding (AUB) 06/05/2017   REFERRING PROVIDER: Jerri Kay HERO, MD   REFERRING DIAG: Closed displaced trimalleolar fracture of right ankle with routine healing,  subsequent encounter   THERAPY DIAG:  Stiffness of right ankle, not elsewhere classified  Pain in right ankle and joints of right foot  Localized edema  Rationale for Evaluation and Treatment: Rehabilitation  ONSET DATE: 04/24/23 (fracture) and 05/06/23 (surgery)  SUBJECTIVE:   SUBJECTIVE STATEMENT: Right ankle pain at a 5/10.  PERTINENT HISTORY: Allergies, current smoker, and history of cancer PAIN:  Are you having pain? Yes: NPRS scale: 5/10 Pain location: right ankle Pain description: constant soreness and burning Aggravating factors: weight bearing Relieving factors: ice and medication  PRECAUTIONS: None  RED FLAGS: None   WEIGHT BEARING RESTRICTIONS: Yes ; patient was told to begin WBAT  FALLS:  Has patient fallen in last 6 months? No  LIVING ENVIRONMENT: Lives with: lives alone Lives in: Mobile home Stairs: Yes: External: 4 steps; yes, but they are not very sturdy Has following equipment at home: shower chair and knee scooter  OCCUPATION: medical claims; works from home  PLOF: Independent  PATIENT GOALS: walk normally, navigate her steps, and be able to do her job without being limited by her pain  NEXT MD VISIT: none scheduled  OBJECTIVE:  Note: Objective measures were completed at Evaluation unless otherwise noted.  PATIENT SURVEYS:  FOTO 42.35 on 08/17/23  COGNITION: Overall cognitive status: Within functional limits for tasks assessed     SENSATION: Patient reports numbness and tingling around the incisions  of her right ankle.  EDEMA:  Figure 8: L: 49.7 cm R: 52.4 cm   PALPATION:  Significant tenderness to palpation throughout her right foot and ankle  LOWER EXTREMITY ROM:  Active ROM Right eval Left eval  Hip flexion    Hip extension    Hip abduction    Hip adduction    Hip internal rotation    Hip external rotation    Knee flexion    Knee extension    Ankle dorsiflexion -15; tight 0  Ankle plantarflexion 30; tight  54   Ankle inversion 8 24  Ankle eversion 3 21   (Blank rows = not tested)  LOWER EXTREMITY MMT: not tested due to surgical condition  FUNCTIONAL TESTS:  Unable to be assessed due to limited weight bearing through RLE  GAIT: Assistive device utilized:  knee scooter Level of assistance: Modified independence Comments: non weight bearing on RLE   TODAY'S TREATMENT:                                                                                                                              DATE:    09/03/23:                                       EXERCISE LOG  Exercise Repetitions and Resistance Comments  Nustep Level 4 x 15 minutes   Rockerboard In parallel bars 2 minutes dorsi/plant and 2 minutes side to side.   Dynadisc In parallel bars for right LE unweighting x 4 minutes                                              08/27/23 EXERCISE LOG   RT ankle  Exercise Repetitions and Resistance Comments  Nustep  L7 x 11 minutes   Rocker board 4 mins forward/backward and left/right and balance    Lunges onto step  14 step x 20 For gastroc/soleus mobility   Narrow BOS on foam   Intermittent UE support   Marching on foam   BUE support   Blank cell = exercise not performed today  Modalities  Date:  Vaso: Ankle,  ,   mins, Pain and Edema                                   08/17/23 EXERCISE LOG  Exercise Repetitions and Resistance Comments  Rocker board (standing)  4 minutes each DF/PF and inversion/ eversion  Dynadisc 3 minutes  CW and CCW   Marching on foam  2 minutes BUE support  Standing gastroc stretch  4 x 30 seconds   Lateral step up  6 step x 3.5 minutes  Alternating LE  Standing soleus stretch  4 x 30 seconds    Blank cell = exercise not performed today  Modalities: no redness or adverse reaction to today's modalities  Date:  Vaso: Ankle, 34 degrees; low pressure, 15 mins, Pain and Edema                                   08/10/23 EXERCISE LOG  Exercise Repetitions  and Resistance Comments  Rocker board  3 minutes each DF/PF and inversion/eversion  Ankle circles  20 reps each  CW and CCW   Standing gastroc stretch  4 x 30 seconds   Seated gastroc stretch  2 x 30 seconds  For HEP  R resisted plantar flexion Red t-band x 30 reps        Blank cell = exercise not performed today   PATIENT EDUCATION:  Education details: healing, HEP, x-ray results, and expectation for soreness Person educated: Patient Education method: Explanation Education comprehension: verbalized understanding  HOME EXERCISE PROGRAM:   ASSESSMENT:  CLINICAL IMPRESSION: Patient is very motivated.  She is compliant to her HEP.  Shorter treatment today as she had an appointment.    OBJECTIVE IMPAIRMENTS: Abnormal gait, decreased activity tolerance, decreased balance, decreased mobility, difficulty walking, decreased ROM, decreased strength, hypomobility, increased edema, impaired sensation, impaired tone, and pain.   ACTIVITY LIMITATIONS: carrying, lifting, standing, squatting, stairs, transfers, bathing, dressing, and locomotion level  PARTICIPATION LIMITATIONS: meal prep, cleaning, laundry, driving, shopping, community activity, and occupation  PERSONAL FACTORS: Past/current experiences, Time since onset of injury/illness/exacerbation, Transportation, and 3+ comorbidities: Allergies, current smoker, and history of cancer  are also affecting patient's functional outcome.   REHAB POTENTIAL: Good  CLINICAL DECISION MAKING: Evolving/moderate complexity  EVALUATION COMPLEXITY: Moderate   GOALS: Goals reviewed with patient? Yes  SHORT TERM GOALS: Target date: 08/12/23 Patient will be independent with her initial HEP. Baseline: Goal status: MET  2.  Patient will be able to complete her daily activities without her familiar pain exceeding 6/10. Baseline:  Goal status: IN PROGRESS  3.  Patient will be able to exhibit 100% weightbearing through her right lower extremity for  improved mobility. Baseline:  Goal status: MET  4.  Patient will improve her right ankle dorsiflexion to within 8 degrees of neutral for improved ankle mobility. Baseline:  Goal status: MET  LONG TERM GOALS: Target date: 09/09/23  Patient will be independent with her advanced HEP. Baseline:  Goal status: IN PROGRESS  2.  Patient will be able to ambulate at least 80 feet without an assistive device for improved household mobility. Baseline:  Goal status: MET  3.  Patient will be able to safely navigate at least 4 steps with a reciprocal pattern for improved household mobility. Baseline:  Goal status: MET  4.  Patient will improve her right ankle dorsiflexion to neutral or greater for improved gait mechanics. Baseline: 4 degrees Goal status: MET  5.  Patient will be able to squat with no significant deviations for improved function picking up items from her floor. Baseline:  Goal status: IN PROGRESS  PLAN:  PT FREQUENCY: 2x/week  PT DURATION: 8 weeks  PLANNED INTERVENTIONS: 97164- PT Re-evaluation, 97110-Therapeutic exercises, 97530- Therapeutic activity, 97112- Neuromuscular re-education, 97535- Self Care, [id number]- Manual therapy, [id number]- Gait training, 97014- Electrical stimulation (unattended), 97016- Vasopneumatic device, 97034- Contrast bath, Patient/Family education, Balance training, Stair training, Joint mobilization, Cryotherapy, and Moist heat  PLAN FOR NEXT  SESSION: NuStep, standing weight shift, desensitization, active and passive ankle range of motion, and modalities as needed  Jahzara Slattery, PT 09/03/2023, 5:27 PM

## 2023-09-07 ENCOUNTER — Ambulatory Visit: Payer: Medicaid Other | Admitting: *Deleted

## 2023-09-08 ENCOUNTER — Ambulatory Visit: Payer: Medicaid Other | Admitting: Physical Therapy

## 2023-09-08 ENCOUNTER — Other Ambulatory Visit: Payer: Self-pay | Admitting: Physician Assistant

## 2023-09-08 ENCOUNTER — Encounter: Payer: Self-pay | Admitting: Orthopaedic Surgery

## 2023-09-08 MED ORDER — ONDANSETRON HCL 4 MG PO TABS: 4.0000 mg | ORAL_TABLET | Freq: Three times a day (TID) | ORAL | 0 refills | Status: AC | PRN

## 2023-09-09 ENCOUNTER — Ambulatory Visit (INDEPENDENT_AMBULATORY_CARE_PROVIDER_SITE_OTHER): Payer: Self-pay

## 2023-09-09 DIAGNOSIS — J309 Allergic rhinitis, unspecified: Secondary | ICD-10-CM | POA: Diagnosis not present

## 2023-09-10 ENCOUNTER — Encounter: Payer: Medicaid Other | Admitting: Physical Therapy

## 2023-09-10 ENCOUNTER — Ambulatory Visit: Payer: Medicaid Other | Admitting: Physical Medicine and Rehabilitation

## 2023-09-10 ENCOUNTER — Encounter: Payer: Self-pay | Admitting: Physical Medicine and Rehabilitation

## 2023-09-10 VITALS — BP 137/86 | HR 87

## 2023-09-10 DIAGNOSIS — G8929 Other chronic pain: Secondary | ICD-10-CM

## 2023-09-10 DIAGNOSIS — M47816 Spondylosis without myelopathy or radiculopathy, lumbar region: Secondary | ICD-10-CM | POA: Diagnosis not present

## 2023-09-10 DIAGNOSIS — M5441 Lumbago with sciatica, right side: Secondary | ICD-10-CM | POA: Diagnosis not present

## 2023-09-10 DIAGNOSIS — M5416 Radiculopathy, lumbar region: Secondary | ICD-10-CM | POA: Diagnosis not present
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
## 2023-09-10 NOTE — Progress Notes (Signed)
 Pain Scale--6

## 2023-09-11 ENCOUNTER — Encounter: Payer: Self-pay | Admitting: Physical Medicine and Rehabilitation

## 2023-09-14 ENCOUNTER — Ambulatory Visit: Payer: Medicaid Other | Attending: Orthopaedic Surgery

## 2023-09-14 ENCOUNTER — Other Ambulatory Visit: Payer: Self-pay

## 2023-09-14 DIAGNOSIS — M5441 Lumbago with sciatica, right side: Secondary | ICD-10-CM | POA: Diagnosis not present

## 2023-09-14 DIAGNOSIS — R600 Localized edema: Secondary | ICD-10-CM | POA: Insufficient documentation

## 2023-09-14 DIAGNOSIS — M5416 Radiculopathy, lumbar region: Secondary | ICD-10-CM | POA: Insufficient documentation

## 2023-09-14 DIAGNOSIS — M25571 Pain in right ankle and joints of right foot: Secondary | ICD-10-CM | POA: Diagnosis present

## 2023-09-14 DIAGNOSIS — S82851D Displaced trimalleolar fracture of right lower leg, subsequent encounter for closed fracture with routine healing: Secondary | ICD-10-CM | POA: Diagnosis not present

## 2023-09-14 DIAGNOSIS — G8929 Other chronic pain: Secondary | ICD-10-CM | POA: Insufficient documentation

## 2023-09-14 DIAGNOSIS — M25671 Stiffness of right ankle, not elsewhere classified: Secondary | ICD-10-CM | POA: Diagnosis present

## 2023-09-14 NOTE — Therapy (Signed)
 OUTPATIENT PHYSICAL THERAPY THORACOLUMBAR EVALUATION   Patient Name: Debbie Delgado MRN: [id number] DOB:06/21/1971, 53 y.o., female Today's Date: 09/14/2023  END OF SESSION:  PT End of Session - 09/14/23 1533     Visit Number 1    Number of Visits 12    Date for PT Re-Evaluation 11/20/23    PT Start Time 1538   Patient arrived late to her appointment.   PT Stop Time 1600    PT Time Calculation (min) 22 min    Activity Tolerance Patient limited by pain    Behavior During Therapy WFL for tasks assessed/performed             Past Medical History:  Diagnosis Date   Cancer (HCC)    cervical   Complication of anesthesia    "hard to wake" hard to put out   Ectopic pregnancy    Endometriosis    Fibroid    Past Surgical History:  Procedure Laterality Date   BREAST BIOPSY Right 12/08/2022   MM RT BREAST BX W LOC DEV 1ST LESION IMAGE BX SPEC STEREO GUIDE 12/08/2022 [facility]   BREAST BIOPSY  02/16/2023   MM RT RADIOACTIVE SEED LOC MAMMO GUIDE 02/16/2023 [facility]   BREAST LUMPECTOMY WITH RADIOACTIVE SEED LOCALIZATION Right 02/17/2023   Procedure: RIGHT BREAST LUMPECTOMY WITH RADIOACTIVE SEED LOCALIZATION;  Surgeon: Harriette Bouillon, MD;  Location: [facility];  Service: General;  Laterality: Right;   CARPAL TUNNEL RELEASE     ORIF ANKLE FRACTURE Right 05/06/2023   Procedure: OPEN REDUCTION INTERNAL FIXATION (ORIF) RIGHT TRIMALLEOLAR FRACTURE;  Surgeon: Tarry Kos, MD;  Location: [facility];  Service: Orthopedics;  Laterality: Right;  block   TONSILLECTOMY     Patient Active Problem List   Diagnosis Date Noted   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-sided sciatica 08/25/2023   Closed displaced trimalleolar fracture of right lower leg 04/25/2023   Anemia 06/05/2017   Abnormal uterine bleeding (AUB) 06/05/2017   REFERRING PROVIDER: Tarry Kos, MD   REFERRING DIAG: Closed displaced trimalleolar fracture of right ankle  with routine healing, subsequent encounter, 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-sided sciatica   Rationale for Evaluation and Treatment: Rehabilitation  THERAPY DIAG:  Stiffness of right ankle, not elsewhere classified  Pain in right ankle and joints of right foot  Radiculopathy, lumbar region  ONSET DATE: 2014 (low back pain) and 04/24/23 (right ankle)   SUBJECTIVE:  SUBJECTIVE STATEMENT: Patient began having problems with her right her after falling off her steps on 04/24/23 and breaking her ankle. Her back began bothering her after falling off 8 cinder block steps in 2014. She feels that her back is been getting worse since it first began. Her right ankle is also "stiff as a board today." She is scheduled to have a MRI on her back this Friday (09/18/23). She feels that "something is pinching back there." She has pain that runs down her right leg especially when she tries to turn to the right. She had been doing physical therapy for her right ankle, but she is now adding the back to her plan of care.   PERTINENT HISTORY:  Allergies, latex allergy, current smoker, and history of cancer  PAIN:  Are you having pain? Yes: NPRS scale: Current: 5-6/10 Best: 5-6/10 Worst: 10/10 Pain location: low back and right lower extremity Pain description: constant Aggravating factors: turning, getting up from the floor Relieving factors: medication  PAIN:  Are you having pain? Yes: NPRS scale: Current: 5/10 Best: 3/10 Worst: 5-6/10 Pain location: right ankle Pain description: stiff  Aggravating factors: movement Relieving factors: NuStep  PRECAUTIONS: Fall  RED FLAGS: None   WEIGHT BEARING RESTRICTIONS: No  FALLS:  Has patient fallen in last 6 months? Yes. Number of falls 2  LIVING  ENVIRONMENT: Lives with: lives alone Lives in: House/apartment Stairs: Yes: External: 1-9 steps; can reach both; step to pattern Has following equipment at home: None  OCCUPATION: medical claims; works from home  PLOF: Independent  PATIENT GOALS: walk normally, reduced pain, and be able to play with her grandchildren  NEXT MD VISIT: none scheduled  OBJECTIVE:  Note: Objective measures were completed at Evaluation unless otherwise noted.  COGNITION: Overall cognitive status: Within functional limits for tasks assessed     SENSATION: Patient reports no numbness or tingling  POSTURE: decreased lumbar lordosis  PALPATION: Unable to be assessed at initial evaluation due to time constraints  LUMBAR ROM:   AROM eval  Flexion 70; familiar pain and required UE support to return to neutral  Extension 10  Right lateral flexion   Left lateral flexion   Right rotation 50% limited; familiar pain   Left rotation 25% limited   (Blank rows = not tested)  LOWER EXTREMITY ROM:     Active  Right eval Left eval  Hip flexion    Hip extension    Hip abduction    Hip adduction    Hip internal rotation    Hip external rotation    Knee flexion    Knee extension    Ankle dorsiflexion -7 9  Ankle plantarflexion 33 49  Ankle inversion 11 24  Ankle eversion 10 15   (Blank rows = not tested)  LOWER EXTREMITY MMT:  unable to be assessed at initial evaluation due to time constraints   MMT Right eval Left eval  Hip flexion    Hip extension    Hip abduction    Hip adduction    Hip internal rotation    Hip external rotation    Knee flexion    Knee extension    Ankle dorsiflexion    Ankle plantarflexion    Ankle inversion    Ankle eversion     (Blank rows = not tested)  LUMBAR SPECIAL TESTS:  Unable to be assessed due to time constraints  GAIT: Assistive device utilized: None Level of assistance: Complete Independence Comments: decreased gait speed and stride  length  TREATMENT DATE:                                                                                                                                  PATIENT EDUCATION:  Education details: Plan of care, and goals for physical therapy Person educated: Patient Education method: Explanation Education comprehension: verbalized understanding  HOME EXERCISE PROGRAM:   ASSESSMENT:  CLINICAL IMPRESSION: Patient is a 53 y.o. female who was seen today for physical therapy evaluation and treatment for right ankle pain and stiffness secondary to a right trimalleolar fracture on 04/24/23 and ORIF on 05/06/23 and chronic right lumbar radiculopathy.  She presented with moderate pain severity and irritability with lumbar flexion and right rotation reproducing her familiar lumbar symptoms and right ankle active range of motion reproducing her familiar ankle symptoms.  Her objective measures were significantly limited due to time constraints as she arrived late to her appointment.  These objective measures will be updated, as able, at follow-up appointments.  Recommend that she continue with skilled physical therapy to address her impairments to maximize her functional mobility.  OBJECTIVE IMPAIRMENTS: Abnormal gait, decreased activity tolerance, decreased balance, decreased mobility, difficulty walking, decreased ROM, decreased strength, hypomobility, impaired flexibility, postural dysfunction, and pain.   ACTIVITY LIMITATIONS: lifting, bending, stairs, transfers, and locomotion level  PARTICIPATION LIMITATIONS: community activity  PERSONAL FACTORS: Past/current experiences, Time since onset of injury/illness/exacerbation, and 3+ comorbidities: Allergies, latex allergy, current smoker, and history of cancer  are also affecting patient's functional outcome.   REHAB POTENTIAL: Fair    CLINICAL DECISION MAKING: Evolving/moderate complexity  EVALUATION COMPLEXITY: Moderate   GOALS: Goals reviewed  with patient? Yes  SHORT TERM GOALS: Target date: 10/05/23  Patient will be independent with her initial HEP. Baseline: Goal status: INITIAL  2.  Patient will be able to demonstrate at least neutral right ankle dorsiflexion for improved gait mechanics. Baseline:  Goal status: INITIAL  3.  Patient will be able to complete her daily activities without her familiar low back pain exceeding 8/10. Baseline:  Goal status: INITIAL  LONG TERM GOALS: Target date: 10/26/23  Patient will be independent with her advanced HEP. Baseline:  Goal status: INITIAL  2.  Patient will improve her right ankle dorsiflexion to at least 5 degrees for improved right foot toe off. Baseline:  Goal status: INITIAL  3.  Patient will be able to demonstrate proper lifting mechanics to reduce her risk of reinjury. Baseline:  Goal status: INITIAL  4.  Patient will be able to complete her daily activities without her familiar low back pain exceeding 6/10. Baseline:  Goal status: INITIAL  5.  Patient will be able to ambulate with minimal to no significant gait deviations. Baseline:  Goal status: INITIAL  PLAN:  PT FREQUENCY: 2x/week  PT DURATION: 6 weeks  PLANNED INTERVENTIONS: 97164- PT Re-evaluation, 97110-Therapeutic exercises, 97530- Therapeutic activity, [id number]- Neuromuscular re-education, 97535- Self Care, 16109- Manual therapy, [phone]- Gait training,  [id number]- Electrical stimulation (unattended), 97016- Vasopneumatic device, [id number]- Ultrasound, Patient/Family education, Balance training, Stair training, Dry Needling, Joint mobilization, Spinal mobilization, Cryotherapy, and Moist heat.  PLAN FOR NEXT SESSION: NuStep, lower trunk rotations, manual therapy, isometrics, and modalities as needed   Granville Lewis, PT 09/14/2023, 6:22 PM

## 2023-09-15 ENCOUNTER — Encounter: Payer: Medicaid Other | Admitting: Physical Therapy

## 2023-09-17 ENCOUNTER — Ambulatory Visit: Payer: Medicaid Other | Attending: Orthopaedic Surgery | Admitting: Physical Therapy

## 2023-09-18 ENCOUNTER — Ambulatory Visit
Admission: RE | Admit: 2023-09-18 | Discharge: 2023-09-18 | Disposition: A | Discharge status: Home / Self Care | Payer: Medicaid Other | Source: Ambulatory Visit | Attending: Physical Medicine and Rehabilitation | Admitting: Physical Medicine and Rehabilitation

## 2023-09-18 DIAGNOSIS — G8929 Other chronic pain: Secondary | ICD-10-CM

## 2023-09-18 DIAGNOSIS — M47816 Spondylosis without myelopathy or radiculopathy, lumbar region: Secondary | ICD-10-CM

## 2023-09-18 DIAGNOSIS — M5416 Radiculopathy, lumbar region: Secondary | ICD-10-CM

## 2023-09-23 ENCOUNTER — Ambulatory Visit (INDEPENDENT_AMBULATORY_CARE_PROVIDER_SITE_OTHER): Payer: Self-pay

## 2023-09-23 DIAGNOSIS — J309 Allergic rhinitis, unspecified: Secondary | ICD-10-CM | POA: Diagnosis not present

## 2023-09-24 ENCOUNTER — Ambulatory Visit: Payer: Medicaid Other

## 2023-09-24 DIAGNOSIS — M25571 Pain in right ankle and joints of right foot: Secondary | ICD-10-CM

## 2023-09-24 DIAGNOSIS — M25671 Stiffness of right ankle, not elsewhere classified: Secondary | ICD-10-CM

## 2023-09-24 DIAGNOSIS — M5416 Radiculopathy, lumbar region: Secondary | ICD-10-CM

## 2023-09-24 DIAGNOSIS — R600 Localized edema: Secondary | ICD-10-CM

## 2023-09-24 NOTE — Therapy (Signed)
 OUTPATIENT PHYSICAL THERAPY THORACOLUMBAR EVALUATION   Patient Name: Debbie Delgado MRN: [id number] DOB:February 13, 1971, 53 y.o., female Today's Date: 09/24/2023  END OF SESSION:  PT End of Session - 09/24/23 1519     Visit Number 2    Number of Visits 12    Date for PT Re-Evaluation 11/20/23    PT Start Time 1515    PT Stop Time 1613    PT Time Calculation (min) 58 min    Activity Tolerance Patient limited by pain    Behavior During Therapy [facility] for tasks assessed/performed             Past Medical History:  Diagnosis Date   Cancer (HCC)    cervical   Complication of anesthesia    "hard to wake" hard to put out   Ectopic pregnancy    Endometriosis    Fibroid    Past Surgical History:  Procedure Laterality Date   BREAST BIOPSY Right 12/08/2022   MM RT BREAST BX W LOC DEV 1ST LESION IMAGE BX SPEC STEREO GUIDE 12/08/2022 [facility]   BREAST BIOPSY  02/16/2023   MM RT RADIOACTIVE SEED LOC MAMMO GUIDE 02/16/2023 [facility]   BREAST LUMPECTOMY WITH RADIOACTIVE SEED LOCALIZATION Right 02/17/2023   Procedure: RIGHT BREAST LUMPECTOMY WITH RADIOACTIVE SEED LOCALIZATION;  Surgeon: Harriette Bouillon, MD;  Location: [facility];  Service: General;  Laterality: Right;   CARPAL TUNNEL RELEASE     ORIF ANKLE FRACTURE Right 05/06/2023   Procedure: OPEN REDUCTION INTERNAL FIXATION (ORIF) RIGHT TRIMALLEOLAR FRACTURE;  Surgeon: Tarry Kos, MD;  Location:  S[facility]ERY [facility];  Service: Orthopedics;  Laterality: Right;  block   TONSILLECTOMY     Patient Active Problem List   Diagnosis Date Noted   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-sided sciatica 08/25/2023   Closed displaced trimalleolar fracture of right lower leg 04/25/2023   Anemia 06/05/2017   Abnormal uterine bleeding (AUB) 06/05/2017   REFERRING PROVIDER: Tarry Kos, MD   REFERRING DIAG: Closed displaced trimalleolar fracture of right ankle with routine healing, subsequent encounter,  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-sided sciatica   Rationale for Evaluation and Treatment: Rehabilitation  THERAPY DIAG:  Stiffness of right ankle, not elsewhere classified  Pain in right ankle and joints of right foot  Radiculopathy, lumbar region  Localized edema  ONSET DATE: 2014 (low back pain) and 04/24/23 (right ankle)   SUBJECTIVE:  SUBJECTIVE STATEMENT: Pt reports 5/10 right ankle pain and 7/10 low back pain today.     PERTINENT HISTORY:  Allergies, latex allergy, current smoker, and history of cancer  PAIN:  Are you having pain? Yes: NPRS scale: Current: 5-6/10 Best: 5-6/10 Worst: 10/10 Pain location: low back and right lower extremity Pain description: constant Aggravating factors: turning, getting up from the floor Relieving factors: medication  PAIN:  Are you having pain? Yes: 5/10 and 7/10  Pain location: right ankle and low back Pain description: stiff  Aggravating factors: movement Relieving factors: NuStep  PRECAUTIONS: Fall  RED FLAGS: None   WEIGHT BEARING RESTRICTIONS: No  FALLS:  Has patient fallen in last 6 months? Yes. Number of falls 2  LIVING ENVIRONMENT: Lives with: lives alone Lives in: House/apartment Stairs: Yes: External: 1-9 steps; can reach both; step to pattern Has following equipment at home: None  OCCUPATION: medical claims; works from home  PLOF: Independent  PATIENT GOALS: walk normally, reduced pain, and be able to play with her grandchildren  NEXT MD VISIT: none scheduled  OBJECTIVE:  Note: Objective measures were completed at Evaluation unless otherwise noted.  COGNITION: Overall cognitive status: Within functional limits for tasks assessed     SENSATION: Patient reports no numbness or tingling  POSTURE: decreased  lumbar lordosis  PALPATION: Unable to be assessed at initial evaluation due to time constraints  LUMBAR ROM:   AROM eval  Flexion 70; familiar pain and required UE support to return to neutral  Extension 10  Right lateral flexion   Left lateral flexion   Right rotation 50% limited; familiar pain   Left rotation 25% limited   (Blank rows = not tested)  LOWER EXTREMITY ROM:     Active  Right eval Left eval  Hip flexion    Hip extension    Hip abduction    Hip adduction    Hip internal rotation    Hip external rotation    Knee flexion    Knee extension    Ankle dorsiflexion -7 9  Ankle plantarflexion 33 49  Ankle inversion 11 24  Ankle eversion 10 15   (Blank rows = not tested)  LOWER EXTREMITY MMT:  unable to be assessed at initial evaluation due to time constraints   MMT Right eval Left eval  Hip flexion    Hip extension    Hip abduction    Hip adduction    Hip internal rotation    Hip external rotation    Knee flexion    Knee extension    Ankle dorsiflexion    Ankle plantarflexion    Ankle inversion    Ankle eversion     (Blank rows = not tested)  LUMBAR SPECIAL TESTS:  Unable to be assessed due to time constraints  GAIT: Assistive device utilized: None Level of assistance: Complete Independence Comments: decreased gait speed and stride length  TREATMENT DATE:  09/24/23                                 EXERCISE LOG  Exercise Repetitions and Resistance Comments  Nustep Lvl 3 x 15 mins   Rockerboard 3 mins   Slant Board 30 sec x 4   Ball Press 3 mins   Ball Rollout 3 mins   LTR 10 reps bil    Blank cell = exercise not performed today   Modalities  Date:  Unattended Estim: Lumbar, IFC 80-150 Hz, 15 mins, Pain and Tone Vaso: Ankle, 34 degrees; low pressure, 15 mins, Pain and Edema Hot Pack: Lumbar, 15 mins, Pain and  Tone   PATIENT EDUCATION:  Education details: Plan of care, and goals for physical therapy Person educated: Patient Education method: Explanation Education comprehension: verbalized understanding  HOME EXERCISE PROGRAM:   ASSESSMENT:  CLINICAL IMPRESSION: Pt arrives for today's treatment session reporting 5/10 right ankle pain and 7/10 low back pain.  Pt introduced to standing rockerboard and gastroc stretch on slant board today for ankle mobility and balance.  Pt also introduced to standing physio-ball exercises to increase low back strength, function, and mobility.  Education provided about proper body mechanics to avoid further back injury.  Normal responses to estim, vaso, and MH noted upon removal.  Pt reported decreased pain at completion of today's treatment session.   OBJECTIVE IMPAIRMENTS: Abnormal gait, decreased activity tolerance, decreased balance, decreased mobility, difficulty walking, decreased ROM, decreased strength, hypomobility, impaired flexibility, postural dysfunction, and pain.   ACTIVITY LIMITATIONS: lifting, bending, stairs, transfers, and locomotion level  PARTICIPATION LIMITATIONS: community activity  PERSONAL FACTORS: Past/current experiences, Time since onset of injury/illness/exacerbation, and 3+ comorbidities: Allergies, latex allergy, current smoker, and history of cancer  are also affecting patient's functional outcome.   REHAB POTENTIAL: Fair    CLINICAL DECISION MAKING: Evolving/moderate complexity  EVALUATION COMPLEXITY: Moderate   GOALS: Goals reviewed with patient? Yes  SHORT TERM GOALS: Target date: 10/05/23  Patient will be independent with her initial HEP. Baseline: Goal status: INITIAL  2.  Patient will be able to demonstrate at least neutral right ankle dorsiflexion for improved gait mechanics. Baseline:  Goal status: INITIAL  3.  Patient will be able to complete her daily activities without her familiar low back pain exceeding  8/10. Baseline:  Goal status: INITIAL  LONG TERM GOALS: Target date: 10/26/23  Patient will be independent with her advanced HEP. Baseline:  Goal status: INITIAL  2.  Patient will improve her right ankle dorsiflexion to at least 5 degrees for improved right foot toe off. Baseline:  Goal status: INITIAL  3.  Patient will be able to demonstrate proper lifting mechanics to reduce her risk of reinjury. Baseline:  Goal status: INITIAL  4.  Patient will be able to complete her daily activities without her familiar low back pain exceeding 6/10. Baseline:  Goal status: INITIAL  5.  Patient will be able to ambulate with minimal to no significant gait deviations. Baseline:  Goal status: INITIAL  PLAN:  PT FREQUENCY: 2x/week  PT DURATION: 6 weeks  PLANNED INTERVENTIONS: 97164- PT Re-evaluation, 97110-Therapeutic exercises, 97530- Therapeutic activity, 97112- Neuromuscular re-education, 97535- Self Care, 16109- Manual therapy, [phone]- Gait training, [phone]- Electrical stimulation (unattended), 97016- Vasopneumatic device, 97035- Ultrasound, Patient/Family education, Balance training, Stair training, Dry Needling, Joint mobilization, Spinal mobilization, Cryotherapy, and Moist heat.  PLAN FOR NEXT SESSION: NuStep, lower trunk rotations, manual therapy, isometrics, and modalities  as needed   Newman Pies, PTA 09/24/2023, 4:18 PM

## 2023-09-25 ENCOUNTER — Other Ambulatory Visit: Payer: Self-pay | Admitting: Physician Assistant

## 2023-09-25 ENCOUNTER — Encounter: Payer: Self-pay | Admitting: Orthopaedic Surgery

## 2023-09-25 NOTE — Telephone Encounter (Signed)
 I don't understand how we can write the same rx as the one she has a refill on as that one without also have auth?

## 2023-09-25 NOTE — Telephone Encounter (Signed)
 Can you ask patient if she needs this or if this is automatic from pharmacy?  If she needs, I can send in one more refill and will then need to refer to pain mgmt

## 2023-09-28 ENCOUNTER — Ambulatory Visit: Payer: Medicaid Other | Attending: Orthopaedic Surgery

## 2023-09-28 DIAGNOSIS — M25571 Pain in right ankle and joints of right foot: Secondary | ICD-10-CM | POA: Insufficient documentation

## 2023-09-28 DIAGNOSIS — R600 Localized edema: Secondary | ICD-10-CM | POA: Insufficient documentation

## 2023-09-28 DIAGNOSIS — M5416 Radiculopathy, lumbar region: Secondary | ICD-10-CM | POA: Insufficient documentation

## 2023-09-28 DIAGNOSIS — M25671 Stiffness of right ankle, not elsewhere classified: Secondary | ICD-10-CM | POA: Insufficient documentation

## 2023-09-30 ENCOUNTER — Ambulatory Visit (INDEPENDENT_AMBULATORY_CARE_PROVIDER_SITE_OTHER): Payer: Self-pay

## 2023-09-30 DIAGNOSIS — J309 Allergic rhinitis, unspecified: Secondary | ICD-10-CM | POA: Diagnosis not present

## 2023-10-01 ENCOUNTER — Ambulatory Visit: Payer: Medicaid Other

## 2023-10-01 DIAGNOSIS — M5416 Radiculopathy, lumbar region: Secondary | ICD-10-CM

## 2023-10-01 DIAGNOSIS — R600 Localized edema: Secondary | ICD-10-CM

## 2023-10-01 DIAGNOSIS — M25571 Pain in right ankle and joints of right foot: Secondary | ICD-10-CM

## 2023-10-01 DIAGNOSIS — M25671 Stiffness of right ankle, not elsewhere classified: Secondary | ICD-10-CM

## 2023-10-01 NOTE — Therapy (Signed)
 OUTPATIENT PHYSICAL THERAPY THORACOLUMBAR TREATMENT   Patient Name: Debbie Delgado MRN: [id number] DOB:1971-04-06, 53 y.o., female Today's Date: 10/01/2023  END OF SESSION:  PT End of Session - 10/01/23 1607     Visit Number 3    Number of Visits 8   8 approved by insurance   Date for PT Re-Evaluation 11/20/23    PT Start Time 1600    PT Stop Time 1654    PT Time Calculation (min) 54 min    Activity Tolerance Patient limited by pain    Behavior During Therapy [facility] for tasks assessed/performed             Past Medical History:  Diagnosis Date   Cancer (HCC)    cervical   Complication of anesthesia    "hard to wake" hard to put out   Ectopic pregnancy    Endometriosis    Fibroid    Past Surgical History:  Procedure Laterality Date   BREAST BIOPSY Right 12/08/2022   MM RT BREAST BX W LOC DEV 1ST LESION IMAGE BX SPEC STEREO GUIDE 12/08/2022 [facility]   BREAST BIOPSY  02/16/2023   MM RT RADIOACTIVE SEED LOC MAMMO GUIDE 02/16/2023 [facility]   BREAST LUMPECTOMY WITH RADIOACTIVE SEED LOCALIZATION Right 02/17/2023   Procedure: RIGHT BREAST LUMPECTOMY WITH RADIOACTIVE SEED LOCALIZATION;  Surgeon: Harriette Bouillon, MD;  Location: [facility];  Service: General;  Laterality: Right;   CARPAL TUNNEL RELEASE     ORIF ANKLE FRACTURE Right 05/06/2023   Procedure: OPEN REDUCTION INTERNAL FIXATION (ORIF) RIGHT TRIMALLEOLAR FRACTURE;  Surgeon: Tarry Kos, MD;  Location: [facility];  Service: Orthopedics;  Laterality: Right;  block   TONSILLECTOMY     Patient Active Problem List   Diagnosis Date Noted   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-sided sciatica 08/25/2023   Closed displaced trimalleolar fracture of right lower leg 04/25/2023   Anemia 06/05/2017   Abnormal uterine bleeding (AUB) 06/05/2017   REFERRING PROVIDER: Tarry Kos, MD   REFERRING DIAG: Closed displaced trimalleolar fracture of right ankle with routine healing,  subsequent encounter, 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-sided sciatica   Rationale for Evaluation and Treatment: Rehabilitation  THERAPY DIAG:  Stiffness of right ankle, not elsewhere classified  Pain in right ankle and joints of right foot  Radiculopathy, lumbar region  Localized edema  ONSET DATE: 2014 (low back pain) and 04/24/23 (right ankle)   SUBJECTIVE:  SUBJECTIVE STATEMENT: Pt reports 3/10 right ankle pain and 5/10 low back pain today.     PERTINENT HISTORY:  Allergies, latex allergy, current smoker, and history of cancer  PAIN:  Are you having pain? Yes: NPRS scale: 5/10 and 3/10  Pain location: low back and right lower extremity Pain description: constant Aggravating factors: turning, getting up from the floor Relieving factors: medication  PAIN:  Are you having pain? Yes: 3/10 and 5/10  Pain location: right ankle and low back Pain description: stiff  Aggravating factors: movement Relieving factors: NuStep  PRECAUTIONS: Fall  RED FLAGS: None   WEIGHT BEARING RESTRICTIONS: No  FALLS:  Has patient fallen in last 6 months? Yes. Number of falls 2  LIVING ENVIRONMENT: Lives with: lives alone Lives in: House/apartment Stairs: Yes: External: 1-9 steps; can reach both; step to pattern Has following equipment at home: None  OCCUPATION: medical claims; works from home  PLOF: Independent  PATIENT GOALS: walk normally, reduced pain, and be able to play with her grandchildren  NEXT MD VISIT: none scheduled  OBJECTIVE:  Note: Objective measures were completed at Evaluation unless otherwise noted.  COGNITION: Overall cognitive status: Within functional limits for tasks assessed     SENSATION: Patient reports no numbness or tingling  POSTURE: decreased lumbar  lordosis  PALPATION: Unable to be assessed at initial evaluation due to time constraints  LUMBAR ROM:   AROM eval  Flexion 70; familiar pain and required UE support to return to neutral  Extension 10  Right lateral flexion   Left lateral flexion   Right rotation 50% limited; familiar pain   Left rotation 25% limited   (Blank rows = not tested)  LOWER EXTREMITY ROM:     Active  Right eval Left eval  Hip flexion    Hip extension    Hip abduction    Hip adduction    Hip internal rotation    Hip external rotation    Knee flexion    Knee extension    Ankle dorsiflexion -7 9  Ankle plantarflexion 33 49  Ankle inversion 11 24  Ankle eversion 10 15   (Blank rows = not tested)  LOWER EXTREMITY MMT:  unable to be assessed at initial evaluation due to time constraints   MMT Right eval Left eval  Hip flexion    Hip extension    Hip abduction    Hip adduction    Hip internal rotation    Hip external rotation    Knee flexion    Knee extension    Ankle dorsiflexion    Ankle plantarflexion    Ankle inversion    Ankle eversion     (Blank rows = not tested)  LUMBAR SPECIAL TESTS:  Unable to be assessed due to time constraints  GAIT: Assistive device utilized: None Level of assistance: Complete Independence Comments: decreased gait speed and stride length  TREATMENT DATE:  10/01/23                               EXERCISE LOG  Exercise Repetitions and Resistance Comments  Nustep Lvl 3  x 16.5 mins 1 mile   Rockerboard 3 mins   Side-Stepping 3 mins   Tandem Gait 3 mins   Slant Board 30 sec x 4   Ball Press 3 mins   Ball Rollout 3 mins   LTR     Blank cell = exercise not performed today   Modalities  Date:  Unattended Estim: Lumbar, IFC 80-150 Hz, 15 mins, Pain and Tone Vaso: Ankle, 34 degrees; low pressure, 15 mins, Pain and  Edema Hot Pack: Lumbar, 15 mins, Pain and Tone   PATIENT EDUCATION:  Education details: Plan of care, and goals for physical therapy Person educated: Patient Education method: Explanation Education comprehension: verbalized understanding  HOME EXERCISE PROGRAM:   ASSESSMENT:  CLINICAL IMPRESSION: Pt arrives for today's treatment session reporting 3/10 right ankle pain and 5/10 low back pain.  Pt introduced to [employer] balance beam exercises today with pt requiring intermittent single UE support for safety and stability.  Normal responses to estim and MH noted upon removal.  Pt reported decreased ankle and low back pain at completion of today's treatment session.    OBJECTIVE IMPAIRMENTS: Abnormal gait, decreased activity tolerance, decreased balance, decreased mobility, difficulty walking, decreased ROM, decreased strength, hypomobility, impaired flexibility, postural dysfunction, and pain.   ACTIVITY LIMITATIONS: lifting, bending, stairs, transfers, and locomotion level  PARTICIPATION LIMITATIONS: community activity  PERSONAL FACTORS: Past/current experiences, Time since onset of injury/illness/exacerbation, and 3+ comorbidities: Allergies, latex allergy, current smoker, and history of cancer  are also affecting patient's functional outcome.   REHAB POTENTIAL: Fair    CLINICAL DECISION MAKING: Evolving/moderate complexity  EVALUATION COMPLEXITY: Moderate   GOALS: Goals reviewed with patient? Yes  SHORT TERM GOALS: Target date: 10/05/23  Patient will be independent with her initial HEP. Baseline: Goal status: INITIAL  2.  Patient will be able to demonstrate at least neutral right ankle dorsiflexion for improved gait mechanics. Baseline:  Goal status: INITIAL  3.  Patient will be able to complete her daily activities without her familiar low back pain exceeding 8/10. Baseline:  Goal status: INITIAL  LONG TERM GOALS: Target date: 10/26/23  Patient will be independent with  her advanced HEP. Baseline:  Goal status: INITIAL  2.  Patient will improve her right ankle dorsiflexion to at least 5 degrees for improved right foot toe off. Baseline:  Goal status: INITIAL  3.  Patient will be able to demonstrate proper lifting mechanics to reduce her risk of reinjury. Baseline:  Goal status: INITIAL  4.  Patient will be able to complete her daily activities without her familiar low back pain exceeding 6/10. Baseline:  Goal status: INITIAL  5.  Patient will be able to ambulate with minimal to no significant gait deviations. Baseline:  Goal status: INITIAL  PLAN:  PT FREQUENCY: 2x/week  PT DURATION: 6 weeks  PLANNED INTERVENTIONS: 97164- PT Re-evaluation, 97110-Therapeutic exercises, 97530- Therapeutic activity, 97112- Neuromuscular re-education, 97535- Self Care, 08657- Manual therapy, [phone]- Gait training, [phone]- Electrical stimulation (unattended), 97016- Vasopneumatic device, 97035- Ultrasound, Patient/Family education, Balance training, Stair training, Dry Needling, Joint mobilization, Spinal mobilization, Cryotherapy, and Moist heat.  PLAN FOR NEXT SESSION: NuStep, lower trunk rotations, manual therapy, isometrics, and modalities as needed   Newman Pies, PTA 10/01/2023, 5:25 PM

## 2023-10-05 NOTE — Therapy (Deleted)
 OUTPATIENT PHYSICAL THERAPY THORACOLUMBAR TREATMENT   Patient Name: Debbie Delgado MRN: [id number] DOB:12/24/1970, 53 y.o., female Today's Date: 10/05/2023  END OF SESSION:    Past Medical History:  Diagnosis Date   Cancer (HCC)    cervical   Complication of anesthesia    "hard to wake" hard to put out   Ectopic pregnancy    Endometriosis    Fibroid    Past Surgical History:  Procedure Laterality Date   BREAST BIOPSY Right 12/08/2022   MM RT BREAST BX W LOC DEV 1ST LESION IMAGE BX SPEC STEREO GUIDE 12/08/2022 [facility]   BREAST BIOPSY  02/16/2023   MM RT RADIOACTIVE SEED LOC MAMMO GUIDE 02/16/2023 [facility]   BREAST LUMPECTOMY WITH RADIOACTIVE SEED LOCALIZATION Right 02/17/2023   Procedure: RIGHT BREAST LUMPECTOMY WITH RADIOACTIVE SEED LOCALIZATION;  Surgeon: Harriette Bouillon, MD;  Location: [facility];  Service: General;  Laterality: Right;   CARPAL TUNNEL RELEASE     ORIF ANKLE FRACTURE Right 05/06/2023   Procedure: OPEN REDUCTION INTERNAL FIXATION (ORIF) RIGHT TRIMALLEOLAR FRACTURE;  Surgeon: Tarry Kos, MD;  Location: [facility];  Service: Orthopedics;  Laterality: Right;  block   TONSILLECTOMY     Patient Active Problem List   Diagnosis Date Noted   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-sided sciatica 08/25/2023   Closed displaced trimalleolar fracture of right lower leg 04/25/2023   Anemia 06/05/2017   Abnormal uterine bleeding (AUB) 06/05/2017   REFERRING PROVIDER: Tarry Kos, MD   REFERRING DIAG: Closed displaced trimalleolar fracture of right ankle with routine healing, subsequent encounter, 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-sided sciatica   Rationale for Evaluation and Treatment: Rehabilitation  THERAPY DIAG:  No diagnosis found.  ONSET DATE: 2014 (low back pain) and 04/24/23 (right ankle)   SUBJECTIVE:                                                                                                                                                                                            SUBJECTIVE STATEMENT: ***  PERTINENT HISTORY:  Allergies, latex allergy, current smoker, and history of cancer  PAIN:  Are you having pain? Yes: NPRS scale: 5/10 and 3/10  Pain location: low back and right lower extremity Pain description: constant Aggravating factors: turning, getting up from the floor Relieving factors: medication  PAIN:  Are you having pain? Yes: 3/10 and 5/10  Pain location: right ankle and low back Pain description: stiff  Aggravating factors: movement Relieving factors: NuStep  PRECAUTIONS: Fall  RED FLAGS: None   WEIGHT BEARING RESTRICTIONS: No  FALLS:  Has patient fallen  in last 6 months? Yes. Number of falls 2  LIVING ENVIRONMENT: Lives with: lives alone Lives in: House/apartment Stairs: Yes: External: 1-9 steps; can reach both; step to pattern Has following equipment at home: None  OCCUPATION: medical claims; works from home  PLOF: Independent  PATIENT GOALS: walk normally, reduced pain, and be able to play with her grandchildren  NEXT MD VISIT: none scheduled  OBJECTIVE:  Note: Objective measures were completed at Evaluation unless otherwise noted.  COGNITION: Overall cognitive status: Within functional limits for tasks assessed     SENSATION: Patient reports no numbness or tingling  POSTURE: decreased lumbar lordosis  PALPATION: Unable to be assessed at initial evaluation due to time constraints  LUMBAR ROM:   AROM eval  Flexion 70; familiar pain and required UE support to return to neutral  Extension 10  Right lateral flexion   Left lateral flexion   Right rotation 50% limited; familiar pain   Left rotation 25% limited   (Blank rows = not tested)  LOWER EXTREMITY ROM:     Active  Right eval Left eval  Hip flexion    Hip extension    Hip abduction    Hip adduction    Hip internal rotation    Hip external rotation     Knee flexion    Knee extension    Ankle dorsiflexion -7 9  Ankle plantarflexion 33 49  Ankle inversion 11 24  Ankle eversion 10 15   (Blank rows = not tested)  LOWER EXTREMITY MMT:  unable to be assessed at initial evaluation due to time constraints   MMT Right eval Left eval  Hip flexion    Hip extension    Hip abduction    Hip adduction    Hip internal rotation    Hip external rotation    Knee flexion    Knee extension    Ankle dorsiflexion    Ankle plantarflexion    Ankle inversion    Ankle eversion     (Blank rows = not tested)  LUMBAR SPECIAL TESTS:  Unable to be assessed due to time constraints  GAIT: Assistive device utilized: None Level of assistance: Complete Independence Comments: decreased gait speed and stride length  TREATMENT DATE:                                                                                                                                                                 10/05/23 EXERCISE LOG  Exercise Repetitions and Resistance Comments                       Blank cell = exercise not performed today   10/01/23  EXERCISE LOG  Exercise Repetitions and Resistance Comments  Nustep Lvl 3  x 16.5 mins 1 mile   Rockerboard 3 mins   Side-Stepping 3 mins   Tandem Gait 3 mins   Slant Board 30 sec x 4   Ball Press 3 mins   Ball Rollout 3 mins   LTR     Blank cell = exercise not performed today   Modalities  Date:  Unattended Estim: Lumbar, IFC 80-150 Hz, 15 mins, Pain and Tone Vaso: Ankle, 34 degrees; low pressure, 15 mins, Pain and Edema Hot Pack: Lumbar, 15 mins, Pain and Tone   PATIENT EDUCATION:  Education details: Plan of care, and goals for physical therapy Person educated: Patient Education method: Explanation Education comprehension: verbalized understanding  HOME EXERCISE PROGRAM:   ASSESSMENT:  CLINICAL IMPRESSION: ***  OBJECTIVE IMPAIRMENTS: Abnormal gait, decreased activity  tolerance, decreased balance, decreased mobility, difficulty walking, decreased ROM, decreased strength, hypomobility, impaired flexibility, postural dysfunction, and pain.   ACTIVITY LIMITATIONS: lifting, bending, stairs, transfers, and locomotion level  PARTICIPATION LIMITATIONS: community activity  PERSONAL FACTORS: Past/current experiences, Time since onset of injury/illness/exacerbation, and 3+ comorbidities: Allergies, latex allergy, current smoker, and history of cancer  are also affecting patient's functional outcome.   REHAB POTENTIAL: Fair    CLINICAL DECISION MAKING: Evolving/moderate complexity  EVALUATION COMPLEXITY: Moderate   GOALS: Goals reviewed with patient? Yes  SHORT TERM GOALS: Target date: 10/05/23  Patient will be independent with her initial HEP. Baseline: Goal status: INITIAL  2.  Patient will be able to demonstrate at least neutral right ankle dorsiflexion for improved gait mechanics. Baseline:  Goal status: INITIAL  3.  Patient will be able to complete her daily activities without her familiar low back pain exceeding 8/10. Baseline:  Goal status: INITIAL  LONG TERM GOALS: Target date: 10/26/23  Patient will be independent with her advanced HEP. Baseline:  Goal status: INITIAL  2.  Patient will improve her right ankle dorsiflexion to at least 5 degrees for improved right foot toe off. Baseline:  Goal status: INITIAL  3.  Patient will be able to demonstrate proper lifting mechanics to reduce her risk of reinjury. Baseline:  Goal status: INITIAL  4.  Patient will be able to complete her daily activities without her familiar low back pain exceeding 6/10. Baseline:  Goal status: INITIAL  5.  Patient will be able to ambulate with minimal to no significant gait deviations. Baseline:  Goal status: INITIAL  PLAN:  PT FREQUENCY: 2x/week  PT DURATION: 6 weeks  PLANNED INTERVENTIONS: 97164- PT Re-evaluation, 97110-Therapeutic exercises, 97530-  Therapeutic activity, 97112- Neuromuscular re-education, 97535- Self Care, 40981- Manual therapy, [phone]- Gait training, [phone]- Electrical stimulation (unattended), 97016- Vasopneumatic device, 97035- Ultrasound, Patient/Family education, Balance training, Stair training, Dry Needling, Joint mobilization, Spinal mobilization, Cryotherapy, and Moist heat.  PLAN FOR NEXT SESSION: NuStep, lower trunk rotations, manual therapy, isometrics, and modalities as needed   Granville Lewis, PT 10/05/2023, 1:12 PM

## 2023-10-06 ENCOUNTER — Ambulatory Visit

## 2023-10-06 DIAGNOSIS — M25671 Stiffness of right ankle, not elsewhere classified: Secondary | ICD-10-CM

## 2023-10-06 DIAGNOSIS — M5416 Radiculopathy, lumbar region: Secondary | ICD-10-CM

## 2023-10-06 DIAGNOSIS — M25571 Pain in right ankle and joints of right foot: Secondary | ICD-10-CM

## 2023-10-06 NOTE — Therapy (Signed)
 OUTPATIENT PHYSICAL THERAPY THORACOLUMBAR TREATMENT   Patient Name: Gazella Anglin MRN: [id number] DOB:1970/10/24, 53 y.o., female Today's Date: 10/06/2023  END OF SESSION:  PT End of Session - 10/06/23 1607     Visit Number 4    Number of Visits 8   8 approved by insurance   Date for PT Re-Evaluation 11/20/23    Authorization Time Period 09/18/23-11/17/23    Authorization - Visit Number 8    PT Start Time 1600    PT Stop Time 1645    PT Time Calculation (min) 45 min    Activity Tolerance Patient tolerated treatment well    Behavior During Therapy WFL for tasks assessed/performed              Past Medical History:  Diagnosis Date   Cancer (HCC)    cervical   Complication of anesthesia    "hard to wake" hard to put out   Ectopic pregnancy    Endometriosis    Fibroid    Past Surgical History:  Procedure Laterality Date   BREAST BIOPSY Right 12/08/2022   MM RT BREAST BX W LOC DEV 1ST LESION IMAGE BX SPEC STEREO GUIDE 12/08/2022 [facility]   BREAST BIOPSY  02/16/2023   MM RT RADIOACTIVE SEED LOC MAMMO GUIDE 02/16/2023 [facility]   BREAST LUMPECTOMY WITH RADIOACTIVE SEED LOCALIZATION Right 02/17/2023   Procedure: RIGHT BREAST LUMPECTOMY WITH RADIOACTIVE SEED LOCALIZATION;  Surgeon: Harriette Bouillon, MD;  Location: [facility];  Service: General;  Laterality: Right;   CARPAL TUNNEL RELEASE     ORIF ANKLE FRACTURE Right 05/06/2023   Procedure: OPEN REDUCTION INTERNAL FIXATION (ORIF) RIGHT TRIMALLEOLAR FRACTURE;  Surgeon: Tarry Kos, MD;  Location: [facility];  Service: Orthopedics;  Laterality: Right;  block   TONSILLECTOMY     Patient Active Problem List   Diagnosis Date Noted   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-sided sciatica 08/25/2023   Closed displaced trimalleolar fracture of right lower leg 04/25/2023   Anemia 06/05/2017   Abnormal uterine bleeding (AUB) 06/05/2017   REFERRING PROVIDER: Tarry Kos, MD    REFERRING DIAG: Closed displaced trimalleolar fracture of right ankle with routine healing, subsequent encounter, 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-sided sciatica   Rationale for Evaluation and Treatment: Rehabilitation  THERAPY DIAG:  Stiffness of right ankle, not elsewhere classified  Pain in right ankle and joints of right foot  Radiculopathy, lumbar region  ONSET DATE: 2014 (low back pain) and 04/24/23 (right ankle)   SUBJECTIVE:  SUBJECTIVE STATEMENT: Patient reports that her ankle is the primary thing bothering her today. She feels that her back is about the same.   PERTINENT HISTORY:  Allergies, latex allergy, current smoker, and history of cancer  PAIN:  Are you having pain? Yes: NPRS scale: 4/10   Pain location: low back and right lower extremity Pain description: constant Aggravating factors: turning, getting up from the floor Relieving factors: medication  PAIN:  Are you having pain? Yes: 3/10 and 5/10  Pain location: right ankle and low back Pain description: stiff  Aggravating factors: movement Relieving factors: NuStep  PRECAUTIONS: Fall  RED FLAGS: None   WEIGHT BEARING RESTRICTIONS: No  FALLS:  Has patient fallen in last 6 months? Yes. Number of falls 2  LIVING ENVIRONMENT: Lives with: lives alone Lives in: House/apartment Stairs: Yes: External: 1-9 steps; can reach both; step to pattern Has following equipment at home: None  OCCUPATION: medical claims; works from home  PLOF: Independent  PATIENT GOALS: walk normally, reduced pain, and be able to play with her grandchildren  NEXT MD VISIT: none scheduled  OBJECTIVE:  Note: Objective measures were completed at Evaluation unless otherwise noted.  COGNITION: Overall cognitive status: Within  functional limits for tasks assessed     SENSATION: Patient reports no numbness or tingling  POSTURE: decreased lumbar lordosis  PALPATION: Unable to be assessed at initial evaluation due to time constraints  LUMBAR ROM:   AROM eval  Flexion 70; familiar pain and required UE support to return to neutral  Extension 10  Right lateral flexion   Left lateral flexion   Right rotation 50% limited; familiar pain   Left rotation 25% limited   (Blank rows = not tested)  LOWER EXTREMITY ROM:     Active  Right eval Left eval  Hip flexion    Hip extension    Hip abduction    Hip adduction    Hip internal rotation    Hip external rotation    Knee flexion    Knee extension    Ankle dorsiflexion -7 9  Ankle plantarflexion 33 49  Ankle inversion 11 24  Ankle eversion 10 15   (Blank rows = not tested)  LOWER EXTREMITY MMT:  unable to be assessed at initial evaluation due to time constraints   MMT Right eval Left eval  Hip flexion    Hip extension    Hip abduction    Hip adduction    Hip internal rotation    Hip external rotation    Knee flexion    Knee extension    Ankle dorsiflexion    Ankle plantarflexion    Ankle inversion    Ankle eversion     (Blank rows = not tested)  LUMBAR SPECIAL TESTS:  Unable to be assessed due to time constraints  GAIT: Assistive device utilized: None Level of assistance: Complete Independence Comments: decreased gait speed and stride length  TREATMENT DATE:  10/06/23 EXERCISE LOG  Exercise Repetitions and Resistance Comments  Nustep  L3 x 15 minutes   Rocker board  5 minutes   Resisted pull down  Green t-band x 20 reps    Tandem stand on foam  3 x 30 seconds each  Intermittent UE support  Lunges onto step  6" step x 20 reps each    Dyna-disc  Attempted, but limited due to  right ankle pain  Seated; circles  Seated step out  Red t-band x 2.5 minutes  For isometric fibularis engagement   Blank cell = exercise not performed today   10/01/23                               EXERCISE LOG  Exercise Repetitions and Resistance Comments  Nustep Lvl 3  x 16.5 mins 1 mile   Rockerboard 3 mins   Side-Stepping 3 mins   Tandem Gait 3 mins   Slant Board 30 sec x 4   Ball Press 3 mins   Ball Rollout 3 mins   LTR     Blank cell = exercise not performed today   Modalities  Date:  Unattended Estim: Lumbar, IFC 80-150 Hz, 15 mins, Pain and Tone Vaso: Ankle, 34 degrees; low pressure, 15 mins, Pain and Edema Hot Pack: Lumbar, 15 mins, Pain and Tone   PATIENT EDUCATION:  Education details: Plan of care, and goals for physical therapy Person educated: Patient Education method: Explanation Education comprehension: verbalized understanding  HOME EXERCISE PROGRAM:   ASSESSMENT:  CLINICAL IMPRESSION: Patient was introduced to multiple new interventions for improved lower extremity mobility and stability with moderate difficulty. She was attempted to be introduced to ankle circles on the dyna-disc, but she was unable to complete this intervention due to her familiar lateral ankle pain. This resulted in this intervention being modified to seated step outs for isometric fibularis engagement. She required minimal cueing with this intervention for ankle stability to limit her range of motion. She reported feeling "alright" upon the conclusion of treatment. She continues to require skilled physical therapy to address her remaining impairments to maximize her functional mobility.   OBJECTIVE IMPAIRMENTS: Abnormal gait, decreased activity tolerance, decreased balance, decreased mobility, difficulty walking, decreased ROM, decreased strength, hypomobility, impaired flexibility, postural dysfunction, and pain.   ACTIVITY LIMITATIONS: lifting, bending, stairs, transfers, and locomotion  level  PARTICIPATION LIMITATIONS: community activity  PERSONAL FACTORS: Past/current experiences, Time since onset of injury/illness/exacerbation, and 3+ comorbidities: Allergies, latex allergy, current smoker, and history of cancer  are also affecting patient's functional outcome.   REHAB POTENTIAL: Fair    CLINICAL DECISION MAKING: Evolving/moderate complexity  EVALUATION COMPLEXITY: Moderate   GOALS: Goals reviewed with patient? Yes  SHORT TERM GOALS: Target date: 10/05/23  Patient will be independent with her initial HEP. Baseline: Goal status: INITIAL  2.  Patient will be able to demonstrate at least neutral right ankle dorsiflexion for improved gait mechanics. Baseline:  Goal status: INITIAL  3.  Patient will be able to complete her daily activities without her familiar low back pain exceeding 8/10. Baseline:  Goal status: INITIAL  LONG TERM GOALS: Target date: 10/26/23  Patient will be independent with her advanced HEP. Baseline:  Goal status: INITIAL  2.  Patient will improve her right ankle dorsiflexion to at least 5 degrees for improved right foot toe off. Baseline:  Goal status: INITIAL  3.  Patient will be able to demonstrate proper lifting  mechanics to reduce her risk of reinjury. Baseline:  Goal status: INITIAL  4.  Patient will be able to complete her daily activities without her familiar low back pain exceeding 6/10. Baseline:  Goal status: INITIAL  5.  Patient will be able to ambulate with minimal to no significant gait deviations. Baseline:  Goal status: INITIAL  PLAN:  PT FREQUENCY: 2x/week  PT DURATION: 6 weeks  PLANNED INTERVENTIONS: 97164- PT Re-evaluation, 97110-Therapeutic exercises, 97530- Therapeutic activity, 97112- Neuromuscular re-education, 97535- Self Care, 16109- Manual therapy, [phone]- Gait training, [phone]- Electrical stimulation (unattended), 97016- Vasopneumatic device, 97035- Ultrasound, Patient/Family education, Balance  training, Stair training, Dry Needling, Joint mobilization, Spinal mobilization, Cryotherapy, and Moist heat.  PLAN FOR NEXT SESSION: NuStep, lower trunk rotations, manual therapy, isometrics, and modalities as needed   Granville Lewis, PT 10/06/2023, 4:55 PM

## 2023-10-07 ENCOUNTER — Ambulatory Visit (INDEPENDENT_AMBULATORY_CARE_PROVIDER_SITE_OTHER): Payer: Self-pay

## 2023-10-07 DIAGNOSIS — J309 Allergic rhinitis, unspecified: Secondary | ICD-10-CM

## 2023-10-08 ENCOUNTER — Ambulatory Visit: Payer: Medicaid Other | Admitting: Physical Medicine and Rehabilitation

## 2023-10-08 ENCOUNTER — Encounter: Payer: Self-pay | Admitting: Physical Medicine and Rehabilitation

## 2023-10-08 VITALS — BP 113/82 | HR 82

## 2023-10-08 DIAGNOSIS — M5441 Lumbago with sciatica, right side: Secondary | ICD-10-CM

## 2023-10-08 DIAGNOSIS — M47816 Spondylosis without myelopathy or radiculopathy, lumbar region: Secondary | ICD-10-CM | POA: Diagnosis not present

## 2023-10-08 DIAGNOSIS — M48061 Spinal stenosis, lumbar region without neurogenic claudication: Secondary | ICD-10-CM | POA: Diagnosis not present

## 2023-10-08 DIAGNOSIS — M5416 Radiculopathy, lumbar region: Secondary | ICD-10-CM | POA: Diagnosis not present

## 2023-10-08 DIAGNOSIS — G8929 Other chronic pain: Secondary | ICD-10-CM | POA: Diagnosis not present

## 2023-10-08 NOTE — Progress Notes (Signed)
 Pain Scale   Average Pain 5
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
## 2023-10-11 ENCOUNTER — Encounter: Payer: Self-pay | Admitting: Orthopaedic Surgery

## 2023-10-12 ENCOUNTER — Ambulatory Visit: Payer: Medicaid Other

## 2023-10-12 DIAGNOSIS — M25571 Pain in right ankle and joints of right foot: Secondary | ICD-10-CM

## 2023-10-12 DIAGNOSIS — R600 Localized edema: Secondary | ICD-10-CM

## 2023-10-12 DIAGNOSIS — M5416 Radiculopathy, lumbar region: Secondary | ICD-10-CM

## 2023-10-12 DIAGNOSIS — M25671 Stiffness of right ankle, not elsewhere classified: Secondary | ICD-10-CM | POA: Diagnosis not present

## 2023-10-12 NOTE — Telephone Encounter (Signed)
 If she is needing robaxin for her back, I will send in one more refill, but will need to defer to pain mgmt after that.  Will also need to get mri of the lumbar spine and have her fu with megan.    As far as fu for her ankle, I would have her come in at next available opening with xu

## 2023-10-12 NOTE — Therapy (Signed)
 OUTPATIENT PHYSICAL THERAPY THORACOLUMBAR TREATMENT   Patient Name: Debbie Delgado MRN: [id number] DOB:11/15/1970, 53 y.o., female Today's Date: 10/12/2023  END OF SESSION:  PT End of Session - 10/12/23 1523     Visit Number 5    Number of Visits 8   8 approved by insurance   Date for PT Re-Evaluation 11/20/23    Authorization Time Period 09/18/23-11/17/23    PT Start Time 1515    PT Stop Time 1600    PT Time Calculation (min) 45 min    Activity Tolerance Patient tolerated treatment well    Behavior During Therapy WFL for tasks assessed/performed               Past Medical History:  Diagnosis Date   Cancer (HCC)    cervical   Complication of anesthesia    "hard to wake" hard to put out   Ectopic pregnancy    Endometriosis    Fibroid    Past Surgical History:  Procedure Laterality Date   BREAST BIOPSY Right 12/08/2022   MM RT BREAST BX W LOC DEV 1ST LESION IMAGE BX SPEC STEREO GUIDE 12/08/2022 [facility]   BREAST BIOPSY  02/16/2023   MM RT RADIOACTIVE SEED LOC MAMMO GUIDE 02/16/2023 [facility]   BREAST LUMPECTOMY WITH RADIOACTIVE SEED LOCALIZATION Right 02/17/2023   Procedure: RIGHT BREAST LUMPECTOMY WITH RADIOACTIVE SEED LOCALIZATION;  Surgeon: Harriette Bouillon, MD;  Location: [facility];  Service: General;  Laterality: Right;   CARPAL TUNNEL RELEASE     ORIF ANKLE FRACTURE Right 05/06/2023   Procedure: OPEN REDUCTION INTERNAL FIXATION (ORIF) RIGHT TRIMALLEOLAR FRACTURE;  Surgeon: Tarry Kos, MD;  Location: [facility];  Service: Orthopedics;  Laterality: Right;  block   TONSILLECTOMY     Patient Active Problem List   Diagnosis Date Noted   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-sided sciatica 08/25/2023   Closed displaced trimalleolar fracture of right lower leg 04/25/2023   Anemia 06/05/2017   Abnormal uterine bleeding (AUB) 06/05/2017   REFERRING PROVIDER: Tarry Kos, MD   REFERRING DIAG: Closed displaced  trimalleolar fracture of right ankle with routine healing, subsequent encounter, 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-sided sciatica   Rationale for Evaluation and Treatment: Rehabilitation  THERAPY DIAG:  Stiffness of right ankle, not elsewhere classified  Pain in right ankle and joints of right foot  Radiculopathy, lumbar region  Localized edema  ONSET DATE: 2014 (low back pain) and 04/24/23 (right ankle)   SUBJECTIVE:  SUBJECTIVE STATEMENT: Patient reports that her back feels alright today. However, her ankle is really hurting today. She notes that she tried bowling on Saturday which she thinks caused this increase in pain.   PERTINENT HISTORY:  Allergies, latex allergy, current smoker, and history of cancer  PAIN:  Are you having pain? Yes: NPRS scale: 4/10 (back) 6/10 (ankle)    Pain location: low back and right lower extremity Pain description: constant Aggravating factors: turning, getting up from the floor Relieving factors: medication  PAIN:  Are you having pain? Yes: 3/10 and 5/10  Pain location: right ankle and low back Pain description: stiff  Aggravating factors: movement Relieving factors: NuStep  PRECAUTIONS: Fall  RED FLAGS: None   WEIGHT BEARING RESTRICTIONS: No  FALLS:  Has patient fallen in last 6 months? Yes. Number of falls 2  LIVING ENVIRONMENT: Lives with: lives alone Lives in: House/apartment Stairs: Yes: External: 1-9 steps; can reach both; step to pattern Has following equipment at home: None  OCCUPATION: medical claims; works from home  PLOF: Independent  PATIENT GOALS: walk normally, reduced pain, and be able to play with her grandchildren  NEXT MD VISIT: none scheduled  OBJECTIVE:  Note: Objective measures were completed at Evaluation  unless otherwise noted.  COGNITION: Overall cognitive status: Within functional limits for tasks assessed     SENSATION: Patient reports no numbness or tingling  POSTURE: decreased lumbar lordosis  PALPATION: Unable to be assessed at initial evaluation due to time constraints  LUMBAR ROM:   AROM eval  Flexion 70; familiar pain and required UE support to return to neutral  Extension 10  Right lateral flexion   Left lateral flexion   Right rotation 50% limited; familiar pain   Left rotation 25% limited   (Blank rows = not tested)  LOWER EXTREMITY ROM:     Active  Right eval Left eval  Hip flexion    Hip extension    Hip abduction    Hip adduction    Hip internal rotation    Hip external rotation    Knee flexion    Knee extension    Ankle dorsiflexion -7 9  Ankle plantarflexion 33 49  Ankle inversion 11 24  Ankle eversion 10 15   (Blank rows = not tested)  LOWER EXTREMITY MMT:  unable to be assessed at initial evaluation due to time constraints   MMT Right eval Left eval  Hip flexion    Hip extension    Hip abduction    Hip adduction    Hip internal rotation    Hip external rotation    Knee flexion    Knee extension    Ankle dorsiflexion    Ankle plantarflexion    Ankle inversion    Ankle eversion     (Blank rows = not tested)  LUMBAR SPECIAL TESTS:  Unable to be assessed due to time constraints  GAIT: Assistive device utilized: None Level of assistance: Complete Independence Comments: decreased gait speed and stride length  TREATMENT DATE:  10/12/23 EXERCISE LOG  Exercise Repetitions and Resistance Comments  Rocker board  6 minutes   Ankle circles                 Blank cell = exercise not performed today  Manual Therapy Soft Tissue Mobilization: right fibularis longus, brevis, and  anterior tibialis, for reduced pain and tone  Joint Mobilizations: right subtalar and distal fibular, grade I-II   Modalities: no redness or adverse reaction to today's modalities  Date:  Unattended Estim: right fibularis longus, pre mod @ , 15 mins, Pain and Edema Vaso: Ankle, 34 degrees; low pressure, 15 mins, Pain and Edema                                   10/06/23 EXERCISE LOG  Exercise Repetitions and Resistance Comments  Nustep  L3 x 15 minutes   Rocker board  5 minutes   Resisted pull down  Green t-band x 20 reps    Tandem stand on foam  3 x 30 seconds each  Intermittent UE support  Lunges onto step  6" step x 20 reps each    Dyna-disc  Attempted, but limited due to right ankle pain  Seated; circles  Seated step out  Red t-band x 2.5 minutes  For isometric fibularis engagement   Blank cell = exercise not performed today   10/01/23                               EXERCISE LOG  Exercise Repetitions and Resistance Comments  Nustep Lvl 3  x 16.5 mins 1 mile   Rockerboard 3 mins   Side-Stepping 3 mins   Tandem Gait 3 mins   Slant Board 30 sec x 4   Ball Press 3 mins   Ball Rollout 3 mins   LTR     Blank cell = exercise not performed today   Modalities  Date:  Unattended Estim: Lumbar, IFC 80-150 Hz, 15 mins, Pain and Tone Vaso: Ankle, 34 degrees; low pressure, 15 mins, Pain and Edema Hot Pack: Lumbar, 15 mins, Pain and Tone   PATIENT EDUCATION:  Education details: Plan of care, and goals for physical therapy Person educated: Patient Education method: Explanation Education comprehension: verbalized understanding  HOME EXERCISE PROGRAM:   ASSESSMENT:  CLINICAL IMPRESSION: Today's treatment focused manual therapy and familiar interventions due to her high pain severity and irritability. Manual therapy focused on soft tissue mobilization to her right fibularis longus with fair results at reducing her familiar symptoms. Today's modalities were the most effective at  reducing her familiar symptoms. She reported that her ankle felt better upon the conclusion of treatment. She continues to require skilled physical therapy to address her remaining impairments to maximize her functional mobility.   OBJECTIVE IMPAIRMENTS: Abnormal gait, decreased activity tolerance, decreased balance, decreased mobility, difficulty walking, decreased ROM, decreased strength, hypomobility, impaired flexibility, postural dysfunction, and pain.   ACTIVITY LIMITATIONS: lifting, bending, stairs, transfers, and locomotion level  PARTICIPATION LIMITATIONS: community activity  PERSONAL FACTORS: Past/current experiences, Time since onset of injury/illness/exacerbation, and 3+ comorbidities: Allergies, latex allergy, current smoker, and history of cancer  are also affecting patient's functional outcome.   REHAB POTENTIAL: Fair    CLINICAL DECISION MAKING: Evolving/moderate complexity  EVALUATION COMPLEXITY: Moderate   GOALS: Goals reviewed with patient? Yes  SHORT TERM GOALS: Target date: 10/05/23  Patient  will be independent with her initial HEP. Baseline: Goal status: INITIAL  2.  Patient will be able to demonstrate at least neutral right ankle dorsiflexion for improved gait mechanics. Baseline:  Goal status: INITIAL  3.  Patient will be able to complete her daily activities without her familiar low back pain exceeding 8/10. Baseline:  Goal status: INITIAL  LONG TERM GOALS: Target date: 10/26/23  Patient will be independent with her advanced HEP. Baseline:  Goal status: INITIAL  2.  Patient will improve her right ankle dorsiflexion to at least 5 degrees for improved right foot toe off. Baseline:  Goal status: INITIAL  3.  Patient will be able to demonstrate proper lifting mechanics to reduce her risk of reinjury. Baseline:  Goal status: INITIAL  4.  Patient will be able to complete her daily activities without her familiar low back pain exceeding 6/10. Baseline:   Goal status: INITIAL  5.  Patient will be able to ambulate with minimal to no significant gait deviations. Baseline:  Goal status: INITIAL  PLAN:  PT FREQUENCY: 2x/week  PT DURATION: 6 weeks  PLANNED INTERVENTIONS: 97164- PT Re-evaluation, 97110-Therapeutic exercises, 97530- Therapeutic activity, 97112- Neuromuscular re-education, 97535- Self Care, 16109- Manual therapy, [phone]- Gait training, [phone]- Electrical stimulation (unattended), 97016- Vasopneumatic device, 97035- Ultrasound, Patient/Family education, Balance training, Stair training, Dry Needling, Joint mobilization, Spinal mobilization, Cryotherapy, and Moist heat.  PLAN FOR NEXT SESSION: NuStep, lower trunk rotations, manual therapy, isometrics, and modalities as needed   Granville Lewis, PT 10/12/2023, 5:21 PM

## 2023-10-14 ENCOUNTER — Ambulatory Visit (INDEPENDENT_AMBULATORY_CARE_PROVIDER_SITE_OTHER): Payer: Self-pay

## 2023-10-14 DIAGNOSIS — J309 Allergic rhinitis, unspecified: Secondary | ICD-10-CM | POA: Diagnosis not present

## 2023-10-15 ENCOUNTER — Ambulatory Visit: Payer: Medicaid Other

## 2023-10-19 ENCOUNTER — Ambulatory Visit: Payer: Medicaid Other

## 2023-10-20 ENCOUNTER — Ambulatory Visit (INDEPENDENT_AMBULATORY_CARE_PROVIDER_SITE_OTHER): Admitting: Orthopaedic Surgery

## 2023-10-20 ENCOUNTER — Other Ambulatory Visit (INDEPENDENT_AMBULATORY_CARE_PROVIDER_SITE_OTHER): Payer: Self-pay

## 2023-10-20 DIAGNOSIS — S82851D Displaced trimalleolar fracture of right lower leg, subsequent encounter for closed fracture with routine healing: Secondary | ICD-10-CM

## 2023-10-20 NOTE — Progress Notes (Signed)
 Office Visit Note   Patient: Debbie Delgado           Date of Birth: June 26, 1971           MRN: [id number] Visit Date: 10/20/2023              Requested by: Orlene Plum, NP [address] [place],  [place] [address] PCP: Orlene Plum, NP   Assessment & Plan: Visit Diagnoses:  1. Closed displaced trimalleolar fracture of right ankle with routine healing, subsequent encounter     Plan: Debbie Delgado is now approximately 7 months status post ORIF right trimalleolar ankle fracture.  Overall happy with her progress with physical therapy.  Her ankle function is really improving.  If she develops any hardware irritation she can follow-up with [place] in about 6 months to talk about getting them taken out.  She feels popping on the medial side the ankle which could be due to the hook plate.  Follow-Up Instructions: No follow-ups on file.   Orders:  Orders Placed This Encounter  Procedures   XR Ankle Complete Right   No orders of the defined types were placed in this encounter.     Procedures: No procedures performed   Clinical Data: No additional findings.   Subjective: Chief Complaint  Patient presents with   Right Ankle - Follow-up    HPI Debbie Delgado returns today for follow-up evaluation status post ORIF of a right trimalleolar ankle fracture.  She underwent surgery in October 2024.  She is currently still in physical therapy.  She has noticed improvement overall and ankle function. Review of Systems   Objective: Vital Signs: LMP 05/22/2017   Physical Exam  Ortho Exam Examination right ankle shows fully healed surgical scars.  Minimal swelling.  No signs of infection.  She has good range of motion of the ankle and subtalar joint. Specialty Comments:  Narrative & Impression CLINICAL DATA:  Low back pain history of prior trauma fall 2014   EXAM: MRI LUMBAR SPINE WITHOUT CONTRAST   TECHNIQUE: Multiplanar, multisequence MR imaging of the lumbar spine was performed.  No intravenous contrast was administered.   COMPARISON:  None Available.   FINDINGS: Segmentation:  Standard.   Alignment:  Physiologic.   Vertebrae: No fracture, evidence of discitis, or bone lesion. 1.5 cm vertebral body hemangioma of the L5 vertebral body   Conus medullaris and cauda equina: Conus extends to the T12 level. Conus and cauda equina appear normal.   Paraspinal and other soft tissues: Negative.   Disc levels:   T12 - L1 No disc protrusion. No foraminal stenosis. No central canal stenosis.   L1-L2: No disc protrusion. No foraminal stenosis. No central canal stenosis.   L2-L3:Mildly desiccated disc with a central, left paracentral bulging disc flattening the thecal sac and encroaching the left neural foramina.   L3-L4: Mild degenerative disc disease, narrowing of the disc space desiccated disc material with a large circumferential spondylitic disc herniation effacing the ventral anterior epidural space flattening the thecal sac and encroachment both neural foramina is aggravated by hypertrophic osteoarthritic changes of the facet joints and ligamentum flavum bilaterally. With mild synovitis.   L4-L5: Degenerative disc disease, narrowing of the disc space desiccated disc material with a medium-sized central spondylitic disc herniation which flattens the thecal sac and extends laterally into both neural foramina left more than right. Aggravated by hypertrophic osteoarthritic changes of the facet joints.   L5-S1: Mild desiccated disc material with a bulging disc flattening the thecal sac.  IMPRESSION: *Multilevel degenerative disc disease with disc herniations at L2-L3, L3-L4, L4-L5 and L5-S1 as described above. *L3-L4: Large circumferential spondylitic disc herniation effacing the ventral anterior epidural space flattening the thecal sac and encroachment both neural foramina is aggravated by hypertrophic osteoarthritic changes of the facet joints and  ligamentum flavum bilaterally. *L4-L5: Medium-sized central spondylitic disc herniation which flattens the thecal sac and extends laterally into both neural foramina left more than right. *L5-S1: Mild desiccated disc material with a bulging disc flattening the thecal sac.     Electronically Signed   By: Shaaron Adler M.D.   On: 10/01/2023 13:11  Imaging: XR Ankle Complete Right Result Date: 10/20/2023 X-rays of the right ankle show healed trimalleolar ankle fracture.  No hardware complications.    PMFS History: Patient Active Problem List   Diagnosis Date Noted   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-sided sciatica 08/25/2023   Closed displaced trimalleolar fracture of right lower leg 04/25/2023   Anemia 06/05/2017   Abnormal uterine bleeding (AUB) 06/05/2017   Past Medical History:  Diagnosis Date   Cancer (HCC)    cervical   Complication of anesthesia    "hard to wake" hard to put out   Ectopic pregnancy    Endometriosis    Fibroid     Family History  Problem Relation Age of Onset   Allergic rhinitis Sister    Breast cancer Maternal Grandmother    Allergic rhinitis Brother    Angioedema Neg Hx    Asthma Neg Hx    Atopy Neg Hx    Eczema Neg Hx    Immunodeficiency Neg Hx    Urticaria Neg Hx     Past Surgical History:  Procedure Laterality Date   BREAST BIOPSY Right 12/08/2022   MM RT BREAST BX W LOC DEV 1ST LESION IMAGE BX SPEC STEREO GUIDE 12/08/2022 [facility]   BREAST BIOPSY  02/16/2023   MM RT RADIOACTIVE SEED LOC MAMMO GUIDE 02/16/2023 [facility]   BREAST LUMPECTOMY WITH RADIOACTIVE SEED LOCALIZATION Right 02/17/2023   Procedure: RIGHT BREAST LUMPECTOMY WITH RADIOACTIVE SEED LOCALIZATION;  Surgeon: Harriette Bouillon, MD;  Location: [facility];  Service: General;  Laterality: Right;   CARPAL TUNNEL RELEASE     ORIF ANKLE FRACTURE Right 05/06/2023   Procedure: OPEN REDUCTION INTERNAL FIXATION (ORIF) RIGHT TRIMALLEOLAR  FRACTURE;  Surgeon: Tarry Kos, MD;  Location: [facility];  Service: Orthopedics;  Laterality: Right;  block   TONSILLECTOMY     Social History   Occupational History   Not on file  Tobacco Use   Smoking status: Every Day    Current packs/day: 0.50    Types: Cigarettes   Smokeless tobacco: Never   Tobacco comments:    1/2 PPD  Substance and Sexual Activity   Alcohol use: Yes    Comment: occasionally   Drug use: No   Sexual activity: Not on file

## 2023-10-21 ENCOUNTER — Ambulatory Visit (INDEPENDENT_AMBULATORY_CARE_PROVIDER_SITE_OTHER): Payer: Self-pay

## 2023-10-21 DIAGNOSIS — J309 Allergic rhinitis, unspecified: Secondary | ICD-10-CM

## 2023-10-22 ENCOUNTER — Ambulatory Visit: Payer: Medicaid Other | Admitting: *Deleted

## 2023-10-22 ENCOUNTER — Encounter: Payer: Self-pay | Admitting: Orthopaedic Surgery

## 2023-10-22 DIAGNOSIS — M25671 Stiffness of right ankle, not elsewhere classified: Secondary | ICD-10-CM

## 2023-10-22 DIAGNOSIS — R600 Localized edema: Secondary | ICD-10-CM

## 2023-10-22 DIAGNOSIS — M5416 Radiculopathy, lumbar region: Secondary | ICD-10-CM

## 2023-10-22 DIAGNOSIS — M25571 Pain in right ankle and joints of right foot: Secondary | ICD-10-CM

## 2023-10-22 NOTE — Therapy (Signed)
 OUTPATIENT PHYSICAL THERAPY THORACOLUMBAR TREATMENT   Patient Name: Debbie Delgado MRN: [id number] DOB:03/09/1971, 53 y.o., female Today's Date: 10/22/2023  END OF SESSION:  PT End of Session - 10/22/23 1528     Visit Number 6    Number of Visits 8    Date for PT Re-Evaluation 11/20/23    Authorization Time Period 09/18/23-11/17/23    PT Start Time 1520    PT Stop Time 1610    PT Time Calculation (min) 50 min               Past Medical History:  Diagnosis Date   Cancer (HCC)    cervical   Complication of anesthesia    "hard to wake" hard to put out   Ectopic pregnancy    Endometriosis    Fibroid    Past Surgical History:  Procedure Laterality Date   BREAST BIOPSY Right 12/08/2022   MM RT BREAST BX W LOC DEV 1ST LESION IMAGE BX SPEC STEREO GUIDE 12/08/2022 [facility]   BREAST BIOPSY  02/16/2023   MM RT RADIOACTIVE SEED LOC MAMMO GUIDE 02/16/2023 [facility]   BREAST LUMPECTOMY WITH RADIOACTIVE SEED LOCALIZATION Right 02/17/2023   Procedure: RIGHT BREAST LUMPECTOMY WITH RADIOACTIVE SEED LOCALIZATION;  Surgeon: Harriette Bouillon, MD;  Location: [facility];  Service: General;  Laterality: Right;   CARPAL TUNNEL RELEASE     ORIF ANKLE FRACTURE Right 05/06/2023   Procedure: OPEN REDUCTION INTERNAL FIXATION (ORIF) RIGHT TRIMALLEOLAR FRACTURE;  Surgeon: Tarry Kos, MD;  Location: [facility];  Service: Orthopedics;  Laterality: Right;  block   TONSILLECTOMY     Patient Active Problem List   Diagnosis Date Noted   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-sided sciatica 08/25/2023   Closed displaced trimalleolar fracture of right lower leg 04/25/2023   Anemia 06/05/2017   Abnormal uterine bleeding (AUB) 06/05/2017   REFERRING PROVIDER: Tarry Kos, MD   REFERRING DIAG: Closed displaced trimalleolar fracture of right ankle with routine healing, subsequent encounter, 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-sided sciatica    Rationale for Evaluation and Treatment: Rehabilitation  THERAPY DIAG:  Stiffness of right ankle, not elsewhere classified  Pain in right ankle and joints of right foot  Radiculopathy, lumbar region  Localized edema  ONSET DATE: 2014 (low back pain) and 04/24/23 (right ankle)   SUBJECTIVE:                                                                                                                                                                                           SUBJECTIVE STATEMENT: Patient reports that her back feels  alright today. RT ankle 3/10. Need to work on ankle circles PERTINENT HISTORY:  Allergies, latex allergy, current smoker, and history of cancer  PAIN:  Are you having pain? Yes: NPRS scale: 3/10 (back) 6/10 (ankle)    Pain location: low back and right lower extremity Pain description: constant Aggravating factors: turning, getting up from the floor Relieving factors: medication  PAIN:  Are you having pain? Yes: 3/10 and 5/10  Pain location: right ankle and low back Pain description: stiff  Aggravating factors: movement Relieving factors: NuStep  PRECAUTIONS: Fall  RED FLAGS: None   WEIGHT BEARING RESTRICTIONS: No  FALLS:  Has patient fallen in last 6 months? Yes. Number of falls 2  LIVING ENVIRONMENT: Lives with: lives alone Lives in: House/apartment Stairs: Yes: External: 1-9 steps; can reach both; step to pattern Has following equipment at home: None  OCCUPATION: medical claims; works from home  PLOF: Independent  PATIENT GOALS: walk normally, reduced pain, and be able to play with her grandchildren  NEXT MD VISIT: none scheduled  OBJECTIVE:  Note: Objective measures were completed at Evaluation unless otherwise noted.  COGNITION: Overall cognitive status: Within functional limits for tasks assessed     SENSATION: Patient reports no numbness or tingling  POSTURE: decreased lumbar lordosis  PALPATION: Unable to be  assessed at initial evaluation due to time constraints  LUMBAR ROM:   AROM eval  Flexion 70; familiar pain and required UE support to return to neutral  Extension 10  Right lateral flexion   Left lateral flexion   Right rotation 50% limited; familiar pain   Left rotation 25% limited   (Blank rows = not tested)  LOWER EXTREMITY ROM:     Active  Right eval Left eval  Hip flexion    Hip extension    Hip abduction    Hip adduction    Hip internal rotation    Hip external rotation    Knee flexion    Knee extension    Ankle dorsiflexion -7 9  Ankle plantarflexion 33 49  Ankle inversion 11 24  Ankle eversion 10 15   (Blank rows = not tested)  LOWER EXTREMITY MMT:  unable to be assessed at initial evaluation due to time constraints   MMT Right eval Left eval  Hip flexion    Hip extension    Hip abduction    Hip adduction    Hip internal rotation    Hip external rotation    Knee flexion    Knee extension    Ankle dorsiflexion    Ankle plantarflexion    Ankle inversion    Ankle eversion     (Blank rows = not tested)  LUMBAR SPECIAL TESTS:  Unable to be assessed due to time constraints  GAIT: Assistive device utilized: None Level of assistance: Complete Independence Comments: decreased gait speed and stride length  TREATMENT DATE:  10/22/23 EXERCISE LOG  Exercise Repetitions and Resistance Comments  Nustep X 15 mins  L3   Rocker board     Dyna disc Ankle circles  x5 mins                Blank cell = exercise not performed today  Manual Therapy PROM for eversion and eversion Soft Tissue Mobilization: right fibularis longus, brevis, and anterior tibialis, for reduced pain and tone  Joint Mobilizations: right Talus, subtalar and distal fibular, grade I-II   Modalities: no redness or adverse reaction to  today's modalities  Date:  Unattended Estim: right fibularis longus, pre mod @ , 15 mins, Pain and Edema Vaso: Ankle, 34 degrees; low pressure, 15 mins, Pain and Edema                                   10/06/23 EXERCISE LOG  Exercise Repetitions and Resistance Comments  Nustep  L3 x 15 minutes   Rocker board  5 minutes   Resisted pull down  Green t-band x 20 reps    Tandem stand on foam  3 x 30 seconds each  Intermittent UE support  Lunges onto step  6" step x 20 reps each    Dyna-disc  Attempted, but limited due to right ankle pain  Seated; circles  Seated step out  Red t-band x 2.5 minutes  For isometric fibularis engagement   Blank cell = exercise not performed today   10/01/23                               EXERCISE LOG  Exercise Repetitions and Resistance Comments  Nustep Lvl 3  x 16.5 mins 1 mile   Rockerboard 3 mins   Side-Stepping 3 mins   Tandem Gait 3 mins   Slant Board 30 sec x 4   Ball Press 3 mins   Ball Rollout 3 mins   LTR     Blank cell = exercise not performed today   Modalities  Date:  Unattended Estim: Lumbar, IFC 80-150 Hz, 15 mins, Pain and Tone Vaso: Ankle, 34 degrees; low pressure, 15 mins, Pain and Edema Hot Pack: Lumbar, 15 mins, Pain and Tone   PATIENT EDUCATION:  Education details: Plan of care, and goals for physical therapy Person educated: Patient Education method: Explanation Education comprehension: verbalized understanding  HOME EXERCISE PROGRAM:   ASSESSMENT:  CLINICAL IMPRESSION: Today's treatment focused  on therex and manual therapy. Pt was able to perform CW and CCW motions on dyna disc and did fairly well.  Manual therapy focused on soft tissue mobilization and PROM as well as ankle mobs. Pt tolerated Rx fairly well with circle CW and CCW being the most challenging.   OBJECTIVE IMPAIRMENTS: Abnormal gait, decreased activity tolerance, decreased balance, decreased mobility, difficulty walking, decreased ROM, decreased strength,  hypomobility, impaired flexibility, postural dysfunction, and pain.   ACTIVITY LIMITATIONS: lifting, bending, stairs, transfers, and locomotion level  PARTICIPATION LIMITATIONS: community activity  PERSONAL FACTORS: Past/current experiences, Time since onset of injury/illness/exacerbation, and 3+ comorbidities: Allergies, latex allergy, current smoker, and history of cancer  are also affecting patient's functional outcome.   REHAB POTENTIAL: Fair    CLINICAL DECISION MAKING: Evolving/moderate complexity  EVALUATION COMPLEXITY: Moderate   GOALS: Goals reviewed with patient? Yes  SHORT TERM GOALS: Target date: 10/05/23  Patient will be independent with her  initial HEP. Baseline: Goal status: MET  2.  Patient will be able to demonstrate at least neutral right ankle dorsiflexion for improved gait mechanics. Baseline:  Goal status: On going  3.  Patient will be able to complete her daily activities without her familiar low back pain exceeding 8/10. Baseline:  Goal status: On going  LONG TERM GOALS: Target date: 10/26/23  Patient will be independent with her advanced HEP. Baseline:  Goal status: INITIAL  2.  Patient will improve her right ankle dorsiflexion to at least 5 degrees for improved right foot toe off. Baseline:  Goal status: INITIAL  3.  Patient will be able to demonstrate proper lifting mechanics to reduce her risk of reinjury. Baseline:  Goal status: INITIAL  4.  Patient will be able to complete her daily activities without her familiar low back pain exceeding 6/10. Baseline:  Goal status: INITIAL  5.  Patient will be able to ambulate with minimal to no significant gait deviations. Baseline:  Goal status: INITIAL  PLAN:  PT FREQUENCY: 2x/week  PT DURATION: 6 weeks  PLANNED INTERVENTIONS: 97164- PT Re-evaluation, 97110-Therapeutic exercises, 97530- Therapeutic activity, 97112- Neuromuscular re-education, 97535- Self Care, 65784- Manual therapy, [phone]-  Gait training, [phone]- Electrical stimulation (unattended), 97016- Vasopneumatic device, 97035- Ultrasound, Patient/Family education, Balance training, Stair training, Dry Needling, Joint mobilization, Spinal mobilization, Cryotherapy, and Moist heat.  PLAN FOR NEXT SESSION: NuStep, lower trunk rotations, manual therapy, isometrics, and modalities as needed   Micaiah Litle,CHRIS, PTA 10/22/2023, 5:37 PM

## 2023-10-26 ENCOUNTER — Ambulatory Visit

## 2023-10-26 DIAGNOSIS — M25671 Stiffness of right ankle, not elsewhere classified: Secondary | ICD-10-CM

## 2023-10-26 DIAGNOSIS — M25571 Pain in right ankle and joints of right foot: Secondary | ICD-10-CM

## 2023-10-26 DIAGNOSIS — R600 Localized edema: Secondary | ICD-10-CM

## 2023-10-26 DIAGNOSIS — M5416 Radiculopathy, lumbar region: Secondary | ICD-10-CM

## 2023-10-26 NOTE — Therapy (Signed)
 OUTPATIENT PHYSICAL THERAPY THORACOLUMBAR TREATMENT   Patient Name: Debbie Delgado MRN: [id number] DOB:11-16-1970, 53 y.o., female Today's Date: 10/26/2023  END OF SESSION:  PT End of Session - 10/26/23 1508     Visit Number 7    Number of Visits 8    Date for PT Re-Evaluation 11/20/23    Authorization Time Period 09/18/23-11/17/23    Authorization - Number of Visits 8    PT Start Time 1515    PT Stop Time 1557    PT Time Calculation (min) 42 min    Activity Tolerance Patient tolerated treatment well    Behavior During Therapy WFL for tasks assessed/performed                Past Medical History:  Diagnosis Date   Cancer (HCC)    cervical   Complication of anesthesia    "hard to wake" hard to put out   Ectopic pregnancy    Endometriosis    Fibroid    Past Surgical History:  Procedure Laterality Date   BREAST BIOPSY Right 12/08/2022   MM RT BREAST BX W LOC DEV 1ST LESION IMAGE BX SPEC STEREO GUIDE 12/08/2022 [facility]   BREAST BIOPSY  02/16/2023   MM RT RADIOACTIVE SEED LOC MAMMO GUIDE 02/16/2023 [facility]   BREAST LUMPECTOMY WITH RADIOACTIVE SEED LOCALIZATION Right 02/17/2023   Procedure: RIGHT BREAST LUMPECTOMY WITH RADIOACTIVE SEED LOCALIZATION;  Surgeon: Harriette Bouillon, MD;  Location: [facility];  Service: General;  Laterality: Right;   CARPAL TUNNEL RELEASE     ORIF ANKLE FRACTURE Right 05/06/2023   Procedure: OPEN REDUCTION INTERNAL FIXATION (ORIF) RIGHT TRIMALLEOLAR FRACTURE;  Surgeon: Tarry Kos, MD;  Location: [facility];  Service: Orthopedics;  Laterality: Right;  block   TONSILLECTOMY     Patient Active Problem List   Diagnosis Date Noted   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-sided sciatica 08/25/2023   Closed displaced trimalleolar fracture of right lower leg 04/25/2023   Anemia 06/05/2017   Abnormal uterine bleeding (AUB) 06/05/2017   REFERRING PROVIDER: Tarry Kos, MD   REFERRING DIAG:  Closed displaced trimalleolar fracture of right ankle with routine healing, subsequent encounter, 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-sided sciatica   Rationale for Evaluation and Treatment: Rehabilitation  THERAPY DIAG:  Stiffness of right ankle, not elsewhere classified  Pain in right ankle and joints of right foot  Radiculopathy, lumbar region  Localized edema  ONSET DATE: 2014 (low back pain) and 04/24/23 (right ankle)   SUBJECTIVE:  SUBJECTIVE STATEMENT: Patient reports that her back is not hurting too bad today. However, her ankle is hurting and tight today. She did notice that her back tried to lock up twice yesterday.  PERTINENT HISTORY:  Allergies, latex allergy, current smoker, and history of cancer  PAIN:  Are you having pain? Yes: NPRS scale: 4.5/10 (back) 5.5-6/10 (ankle)    Pain location: low back and right lower extremity Pain description: constant Aggravating factors: turning, getting up from the floor Relieving factors: medication  PAIN:  Are you having pain? Yes: 3/10 and 5/10  Pain location: right ankle and low back Pain description: stiff  Aggravating factors: movement Relieving factors: NuStep  PRECAUTIONS: Fall  RED FLAGS: None   WEIGHT BEARING RESTRICTIONS: No  FALLS:  Has patient fallen in last 6 months? Yes. Number of falls 2  LIVING ENVIRONMENT: Lives with: lives alone Lives in: House/apartment Stairs: Yes: External: 1-9 steps; can reach both; step to pattern Has following equipment at home: None  OCCUPATION: medical claims; works from home  PLOF: Independent  PATIENT GOALS: walk normally, reduced pain, and be able to play with her grandchildren  NEXT MD VISIT: none scheduled  OBJECTIVE:  Note: Objective measures were completed at Evaluation  unless otherwise noted.  COGNITION: Overall cognitive status: Within functional limits for tasks assessed     SENSATION: Patient reports no numbness or tingling  POSTURE: decreased lumbar lordosis  PALPATION: Unable to be assessed at initial evaluation due to time constraints  LUMBAR ROM:   AROM eval  Flexion 70; familiar pain and required UE support to return to neutral  Extension 10  Right lateral flexion   Left lateral flexion   Right rotation 50% limited; familiar pain   Left rotation 25% limited   (Blank rows = not tested)  LOWER EXTREMITY ROM:     Active  Right eval Left eval  Hip flexion    Hip extension    Hip abduction    Hip adduction    Hip internal rotation    Hip external rotation    Knee flexion    Knee extension    Ankle dorsiflexion -7 9  Ankle plantarflexion 33 49  Ankle inversion 11 24  Ankle eversion 10 15   (Blank rows = not tested)  LOWER EXTREMITY MMT:  unable to be assessed at initial evaluation due to time constraints   MMT Right eval Left eval  Hip flexion    Hip extension    Hip abduction    Hip adduction    Hip internal rotation    Hip external rotation    Knee flexion    Knee extension    Ankle dorsiflexion    Ankle plantarflexion    Ankle inversion    Ankle eversion     (Blank rows = not tested)  LUMBAR SPECIAL TESTS:  Unable to be assessed due to time constraints  GAIT: Assistive device utilized: None Level of assistance: Complete Independence Comments: decreased gait speed and stride length  TREATMENT DATE:  10/26/23 EXERCISE LOG  Exercise Repetitions and Resistance Comments  Nustep  L3 x 15 minutes   Rocker board  5 minutes    Blank cell = exercise not performed today  Manual Therapy Soft Tissue Mobilization: right fibularis, for improved soft tissue  extensibility Joint Mobilizations: R subtalar, talocrural, and metatarsal, grade I-III for improved  Passive ROM: R ankle, all planes to tolerance                                    10/22/23 EXERCISE LOG  Exercise Repetitions and Resistance Comments  Nustep X 15 mins  L3   Rocker board     Dyna disc Ankle circles  x5 mins                Blank cell = exercise not performed today  Manual Therapy PROM for eversion and eversion Soft Tissue Mobilization: right fibularis longus, brevis, and anterior tibialis, for reduced pain and tone  Joint Mobilizations: right Talus, subtalar and distal fibular, grade I-II   Modalities: no redness or adverse reaction to today's modalities  Date:  Unattended Estim: right fibularis longus, pre mod @ , 15 mins, Pain and Edema Vaso: Ankle, 34 degrees; low pressure, 15 mins, Pain and Edema                                   10/06/23 EXERCISE LOG  Exercise Repetitions and Resistance Comments  Nustep  L3 x 15 minutes   Rocker board  5 minutes   Resisted pull down  Green t-band x 20 reps    Tandem stand on foam  3 x 30 seconds each  Intermittent UE support  Lunges onto step  6" step x 20 reps each    Dyna-disc  Attempted, but limited due to right ankle pain  Seated; circles  Seated step out  Red t-band x 2.5 minutes  For isometric fibularis engagement   Blank cell = exercise not performed today   PATIENT EDUCATION:  Education details: Plan of care, and goals for physical therapy Person educated: Patient Education method: Explanation Education comprehension: verbalized understanding  HOME EXERCISE PROGRAM:   ASSESSMENT:  CLINICAL IMPRESSION: Today's treatment focused on improved right ankle range of motion through the use of manual therapy and appropriately matched interventions.  Manual therapy focused on right subtalar and talocrural joint mobilizations throughout her available range of motion.  She experienced a mild improvement in her familiar  symptoms and right ankle mobility with today's interventions.  However, she continued to report right ankle pain upon the conclusion of treatment.  Her progress with skilled physical therapy will be reevaluated at her next appointment to determine the effectiveness of skilled physical therapy.  OBJECTIVE IMPAIRMENTS: Abnormal gait, decreased activity tolerance, decreased balance, decreased mobility, difficulty walking, decreased ROM, decreased strength, hypomobility, impaired flexibility, postural dysfunction, and pain.   ACTIVITY LIMITATIONS: lifting, bending, stairs, transfers, and locomotion level  PARTICIPATION LIMITATIONS: community activity  PERSONAL FACTORS: Past/current experiences, Time since onset of injury/illness/exacerbation, and 3+ comorbidities: Allergies, latex allergy, current smoker, and history of cancer  are also affecting patient's functional outcome.   REHAB POTENTIAL: Fair    CLINICAL DECISION MAKING: Evolving/moderate complexity  EVALUATION COMPLEXITY: Moderate   GOALS: Goals reviewed with patient? Yes  SHORT TERM GOALS: Target date: 10/05/23  Patient will be independent with her initial  HEP. Baseline: Goal status: MET  2.  Patient will be able to demonstrate at least neutral right ankle dorsiflexion for improved gait mechanics. Baseline:  Goal status: On going  3.  Patient will be able to complete her daily activities without her familiar low back pain exceeding 8/10. Baseline:  Goal status: On going  LONG TERM GOALS: Target date: 10/26/23  Patient will be independent with her advanced HEP. Baseline:  Goal status: INITIAL  2.  Patient will improve her right ankle dorsiflexion to at least 5 degrees for improved right foot toe off. Baseline:  Goal status: INITIAL  3.  Patient will be able to demonstrate proper lifting mechanics to reduce her risk of reinjury. Baseline:  Goal status: INITIAL  4.  Patient will be able to complete her daily activities  without her familiar low back pain exceeding 6/10. Baseline:  Goal status: INITIAL  5.  Patient will be able to ambulate with minimal to no significant gait deviations. Baseline:  Goal status: INITIAL  PLAN:  PT FREQUENCY: 2x/week  PT DURATION: 6 weeks  PLANNED INTERVENTIONS: 97164- PT Re-evaluation, 97110-Therapeutic exercises, 97530- Therapeutic activity, 97112- Neuromuscular re-education, 97535- Self Care, 60454- Manual therapy, [phone]- Gait training, [phone]- Electrical stimulation (unattended), 97016- Vasopneumatic device, 97035- Ultrasound, Patient/Family education, Balance training, Stair training, Dry Needling, Joint mobilization, Spinal mobilization, Cryotherapy, and Moist heat.  PLAN FOR NEXT SESSION: NuStep, lower trunk rotations, manual therapy, isometrics, and modalities as needed   Granville Lewis, PT 10/26/2023, 4:40 PM

## 2023-10-27 ENCOUNTER — Ambulatory Visit: Admitting: Orthopaedic Surgery

## 2023-10-28 ENCOUNTER — Ambulatory Visit (INDEPENDENT_AMBULATORY_CARE_PROVIDER_SITE_OTHER): Payer: Self-pay

## 2023-10-28 DIAGNOSIS — J309 Allergic rhinitis, unspecified: Secondary | ICD-10-CM

## 2023-10-29 ENCOUNTER — Encounter

## 2023-11-02 ENCOUNTER — Ambulatory Visit: Attending: Orthopaedic Surgery

## 2023-11-02 DIAGNOSIS — R600 Localized edema: Secondary | ICD-10-CM | POA: Insufficient documentation

## 2023-11-02 DIAGNOSIS — M25671 Stiffness of right ankle, not elsewhere classified: Secondary | ICD-10-CM | POA: Diagnosis present

## 2023-11-02 DIAGNOSIS — M5416 Radiculopathy, lumbar region: Secondary | ICD-10-CM | POA: Insufficient documentation

## 2023-11-02 DIAGNOSIS — M25571 Pain in right ankle and joints of right foot: Secondary | ICD-10-CM | POA: Diagnosis present

## 2023-11-02 NOTE — Therapy (Signed)
 OUTPATIENT PHYSICAL THERAPY THORACOLUMBAR TREATMENT   Patient Name: Lucrecia Mcphearson MRN: [id number] DOB:05-19-1971, 53 y.o., female Today's Date: 11/02/2023  END OF SESSION:  PT End of Session - 11/02/23 1602     Visit Number 8    Number of Visits 8    Date for PT Re-Evaluation 11/20/23    Authorization Time Period 09/18/23-11/17/23    Authorization - Number of Visits 8    PT Start Time 1600    PT Stop Time 1641    PT Time Calculation (min) 41 min    Activity Tolerance Patient tolerated treatment well    Behavior During Therapy WFL for tasks assessed/performed                 Past Medical History:  Diagnosis Date   Cancer (HCC)    cervical   Complication of anesthesia    "hard to wake" hard to put out   Ectopic pregnancy    Endometriosis    Fibroid    Past Surgical History:  Procedure Laterality Date   BREAST BIOPSY Right 12/08/2022   MM RT BREAST BX W LOC DEV 1ST LESION IMAGE BX SPEC STEREO GUIDE 12/08/2022 [facility]   BREAST BIOPSY  02/16/2023   MM RT RADIOACTIVE SEED LOC MAMMO GUIDE 02/16/2023 [facility]   BREAST LUMPECTOMY WITH RADIOACTIVE SEED LOCALIZATION Right 02/17/2023   Procedure: RIGHT BREAST LUMPECTOMY WITH RADIOACTIVE SEED LOCALIZATION;  Surgeon: Harriette Bouillon, MD;  Location: [facility];  Service: General;  Laterality: Right;   CARPAL TUNNEL RELEASE     ORIF ANKLE FRACTURE Right 05/06/2023   Procedure: OPEN REDUCTION INTERNAL FIXATION (ORIF) RIGHT TRIMALLEOLAR FRACTURE;  Surgeon: Tarry Kos, MD;  Location: [facility];  Service: Orthopedics;  Laterality: Right;  block   TONSILLECTOMY     Patient Active Problem List   Diagnosis Date Noted   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-sided sciatica 08/25/2023   Closed displaced trimalleolar fracture of right lower leg 04/25/2023   Anemia 06/05/2017   Abnormal uterine bleeding (AUB) 06/05/2017   REFERRING PROVIDER: Tarry Kos, MD   REFERRING DIAG:  Closed displaced trimalleolar fracture of right ankle with routine healing, subsequent encounter, 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-sided sciatica   Rationale for Evaluation and Treatment: Rehabilitation  THERAPY DIAG:  Stiffness of right ankle, not elsewhere classified  Pain in right ankle and joints of right foot  Radiculopathy, lumbar region  Localized edema  ONSET DATE: 2014 (low back pain) and 04/24/23 (right ankle)   SUBJECTIVE:  SUBJECTIVE STATEMENT: Patient reports that her back is hurting a little today. Her ankle is also a little stiff today, but it is due to the weather.  PERTINENT HISTORY:  Allergies, latex allergy, current smoker, and history of cancer  PAIN:  Are you having pain? Yes: NPRS scale: 5/10 (back) 4/10 (ankle)    Pain location: low back and right lower extremity Pain description: constant Aggravating factors: turning, getting up from the floor Relieving factors: medication  PRECAUTIONS: Fall  RED FLAGS: None   WEIGHT BEARING RESTRICTIONS: No  FALLS:  Has patient fallen in last 6 months? Yes. Number of falls 2  LIVING ENVIRONMENT: Lives with: lives alone Lives in: House/apartment Stairs: Yes: External: 1-9 steps; can reach both; step to pattern Has following equipment at home: None  OCCUPATION: medical claims; works from home  PLOF: Independent  PATIENT GOALS: walk normally, reduced pain, and be able to play with her grandchildren  NEXT MD VISIT: none scheduled  OBJECTIVE:  Note: Objective measures were completed at Evaluation unless otherwise noted.  COGNITION: Overall cognitive status: Within functional limits for tasks assessed     SENSATION: Patient reports no numbness or tingling  POSTURE: decreased lumbar  lordosis  PALPATION: Unable to be assessed at initial evaluation due to time constraints  LUMBAR ROM:   AROM eval  Flexion 70; familiar pain and required UE support to return to neutral  Extension 10  Right lateral flexion   Left lateral flexion   Right rotation 50% limited; familiar pain   Left rotation 25% limited   (Blank rows = not tested)  LOWER EXTREMITY ROM:     Active  Right eval Right 11/02/23 Left eval  Hip flexion     Hip extension     Hip abduction     Hip adduction     Hip internal rotation     Hip external rotation     Knee flexion     Knee extension     Ankle dorsiflexion -7 -4 9  Ankle plantarflexion 33 35 49  Ankle inversion 11 35 24  Ankle eversion 10 20 15    (Blank rows = not tested)  LOWER EXTREMITY MMT:  unable to be assessed at initial evaluation due to time constraints   MMT Right eval Left eval  Hip flexion    Hip extension    Hip abduction    Hip adduction    Hip internal rotation    Hip external rotation    Knee flexion    Knee extension    Ankle dorsiflexion    Ankle plantarflexion    Ankle inversion    Ankle eversion     (Blank rows = not tested)  LUMBAR SPECIAL TESTS:  Unable to be assessed due to time constraints  GAIT: Assistive device utilized: None Level of assistance: Complete Independence Comments: decreased gait speed and stride length  TREATMENT DATE:  11/02/23  EXERCISE LOG  Exercise Repetitions and Resistance Comments  Nustep  L4 x 18 minutes   Gastroc stretch  4 x 30 seconds RLE only; long sitting  Rocker board  5 minutes  For ankle mobility   Seated hip ADD isometric  3 minutes w/ 5 second hold Ball squeeze       Blank cell = exercise not performed today                                    10/26/23 EXERCISE LOG  Exercise Repetitions and Resistance  Comments  Nustep  L3 x 15 minutes   Rocker board  5 minutes    Blank cell = exercise not performed today  Manual Therapy Soft Tissue Mobilization: right fibularis, for improved soft tissue extensibility Joint Mobilizations: R subtalar, talocrural, and metatarsal, grade I-III for improved  Passive ROM: R ankle, all planes to tolerance                                    10/22/23 EXERCISE LOG  Exercise Repetitions and Resistance Comments  Nustep X 15 mins  L3   Rocker board     Dyna disc Ankle circles  x5 mins                Blank cell = exercise not performed today  Manual Therapy PROM for eversion and eversion Soft Tissue Mobilization: right fibularis longus, brevis, and anterior tibialis, for reduced pain and tone  Joint Mobilizations: right Talus, subtalar and distal fibular, grade I-II   Modalities: no redness or adverse reaction to today's modalities  Date:  Unattended Estim: right fibularis longus, pre mod @ , 15 mins, Pain and Edema Vaso: Ankle, 34 degrees; low pressure, 15 mins, Pain and Edema  PATIENT EDUCATION:  Education details: plan of care, objective findings, and progress with physical therapy Person educated: Patient Education method: Explanation Education comprehension: verbalized understanding  HOME EXERCISE PROGRAM:   ASSESSMENT:  CLINICAL IMPRESSION: Patient has made fair progress with skilled physical therapy as evidenced by her subjective reports, objective measures, functional mobility, and progress towards her goals. She was able to demonstrate a significant improvement in right ankle inversion and eversion active range of motion. However, she continues to exhibit limited right ankle dorsiflexion and elevated pain levels which limit her ability to be progressed with physical therapy. Today's intervention focused on improved right ankle mobility. She required moderate cueing with the gastroc stretch for proper exercise performance and for a 30 second hold  to facilitate improved soft tissue extensibility. She experienced a mild increase in her familiar right ankle symptoms with today's interventions. Recommend that she continue with skilled physical therapy to address her impairments to maximize her functional mobility.  OBJECTIVE IMPAIRMENTS: Abnormal gait, decreased activity tolerance, decreased balance, decreased mobility, difficulty walking, decreased ROM, decreased strength, hypomobility, impaired flexibility, postural dysfunction, and pain.   ACTIVITY LIMITATIONS: lifting, bending, stairs, transfers, and locomotion level  PARTICIPATION LIMITATIONS: community activity  PERSONAL FACTORS: Past/current experiences, Time since onset of injury/illness/exacerbation, and 3+ comorbidities: Allergies, latex allergy, current smoker, and history of cancer  are also affecting patient's functional outcome.   REHAB POTENTIAL: Fair    CLINICAL DECISION MAKING: Evolving/moderate complexity  EVALUATION COMPLEXITY: Moderate   GOALS: Goals reviewed with patient? Yes  SHORT TERM GOALS: Target date: 10/05/23  Patient  will be independent with her initial HEP. Baseline: Goal status: MET  2.  Patient will be able to demonstrate at least neutral right ankle dorsiflexion for improved gait mechanics. Baseline: see objective  Goal status: On going  3.  Patient will be able to complete her daily activities without her familiar low back pain exceeding 8/10. Baseline:  Goal status: MET  LONG TERM GOALS: Target date: 10/26/23  Patient will be independent with her advanced HEP. Baseline: limited by pain Goal status: PARTIALLY MET  2.  Patient will improve her right ankle dorsiflexion to at least 5 degrees for improved right foot toe off. Baseline: see objective Goal status: NOT MET  3.  Patient will be able to demonstrate proper lifting mechanics to reduce her risk of reinjury. Baseline: narrow BOS with increased trunk flexion Goal status: NOT MET    4.  Patient will be able to complete her daily activities without her familiar low back pain exceeding 6/10. Baseline:  Goal status: NOT MET  5.  Patient will be able to ambulate with minimal to no significant gait deviations. Baseline: reduced stance time on RLE due to pain  Goal status: NOT MET  PLAN:  PT FREQUENCY: 2x/week  PT DURATION: 6 weeks  PLANNED INTERVENTIONS: 97164- PT Re-evaluation, 97110-Therapeutic exercises, 97530- Therapeutic activity, 97112- Neuromuscular re-education, 97535- Self Care, 29528- Manual therapy, [phone]- Gait training, [phone]- Electrical stimulation (unattended), 97016- Vasopneumatic device, 97035- Ultrasound, Patient/Family education, Balance training, Stair training, Dry Needling, Joint mobilization, Spinal mobilization, Cryotherapy, and Moist heat.  PLAN FOR NEXT SESSION: NuStep, lower trunk rotations, manual therapy, isometrics, and modalities as needed   Granville Lewis, PT 11/02/2023, 6:42 PM

## 2023-11-04 ENCOUNTER — Ambulatory Visit (INDEPENDENT_AMBULATORY_CARE_PROVIDER_SITE_OTHER): Payer: Self-pay

## 2023-11-04 DIAGNOSIS — J309 Allergic rhinitis, unspecified: Secondary | ICD-10-CM | POA: Diagnosis not present

## 2023-11-09 ENCOUNTER — Ambulatory Visit

## 2023-11-10 ENCOUNTER — Ambulatory Visit

## 2023-11-10 DIAGNOSIS — M5416 Radiculopathy, lumbar region: Secondary | ICD-10-CM

## 2023-11-10 DIAGNOSIS — M25571 Pain in right ankle and joints of right foot: Secondary | ICD-10-CM

## 2023-11-10 DIAGNOSIS — M25671 Stiffness of right ankle, not elsewhere classified: Secondary | ICD-10-CM | POA: Diagnosis not present

## 2023-11-10 DIAGNOSIS — J301 Allergic rhinitis due to pollen: Secondary | ICD-10-CM | POA: Diagnosis not present

## 2023-11-10 NOTE — Therapy (Addendum)
 OUTPATIENT PHYSICAL THERAPY THORACOLUMBAR TREATMENT   Patient Name: Debbie Delgado MRN: [id number] DOB:27-Apr-1971, 53 y.o., female Today's Date: 11/10/2023  END OF SESSION:  PT End of Session - 11/10/23 1357     Visit Number 9    Number of Visits 12    Date for PT Re-Evaluation 11/20/23    Authorization Time Period 09/18/23-11/17/23    Authorization - Number of Visits 8    PT Start Time 1351   Patient arrived late to her appointment.   PT Stop Time 1431    PT Time Calculation (min) 40 min    Activity Tolerance Patient tolerated treatment well    Behavior During Therapy WFL for tasks assessed/performed                  Past Medical History:  Diagnosis Date   Cancer (HCC)    cervical   Complication of anesthesia    "hard to wake" hard to put out   Ectopic pregnancy    Endometriosis    Fibroid    Past Surgical History:  Procedure Laterality Date   BREAST BIOPSY Right 12/08/2022   MM RT BREAST BX W LOC DEV 1ST LESION IMAGE BX SPEC STEREO GUIDE 12/08/2022 [facility]   BREAST BIOPSY  02/16/2023   MM RT RADIOACTIVE SEED LOC MAMMO GUIDE 02/16/2023 [facility]   BREAST LUMPECTOMY WITH RADIOACTIVE SEED LOCALIZATION Right 02/17/2023   Procedure: RIGHT BREAST LUMPECTOMY WITH RADIOACTIVE SEED LOCALIZATION;  Surgeon: Sim Dryer, MD;  Location: [facility];  Service: General;  Laterality: Right;   CARPAL TUNNEL RELEASE     ORIF ANKLE FRACTURE Right 05/06/2023   Procedure: OPEN REDUCTION INTERNAL FIXATION (ORIF) RIGHT TRIMALLEOLAR FRACTURE;  Surgeon: Wes Hamman, MD;  Location:  SUR[facility]ERY CENTER;  Service: Orthopedics;  Laterality: Right;  block   TONSILLECTOMY     Patient Active Problem List   Diagnosis Date Noted   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-sided sciatica 08/25/2023   Closed displaced trimalleolar fracture of right lower leg 04/25/2023   Anemia 06/05/2017   Abnormal uterine bleeding (AUB) 06/05/2017   REFERRING  PROVIDER: Wes Hamman, MD   REFERRING DIAG: Closed displaced trimalleolar fracture of right ankle with routine healing, subsequent encounter, 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-sided sciatica   Rationale for Evaluation and Treatment: Rehabilitation  THERAPY DIAG:  Stiffness of right ankle, not elsewhere classified  Pain in right ankle and joints of right foot  Radiculopathy, lumbar region  ONSET DATE: 2014 (low back pain) and 04/24/23 (right ankle)   SUBJECTIVE:  SUBJECTIVE STATEMENT: Patient reports that she is not hurting any today.   PERTINENT HISTORY:  Allergies, latex allergy , current smoker, and history of cancer  PAIN:  Are you having pain? Yes: NPRS scale: 0/10 (back) 0/10 (ankle)    Pain location: low back and right lower extremity Pain description: constant Aggravating factors: turning, getting up from the floor Relieving factors: medication  PRECAUTIONS: Fall  RED FLAGS: None   WEIGHT BEARING RESTRICTIONS: No  FALLS:  Has patient fallen in last 6 months? Yes. Number of falls 2  LIVING ENVIRONMENT: Lives with: lives alone Lives in: House/apartment Stairs: Yes: External: 1-9 steps; can reach both; step to pattern Has following equipment at home: None  OCCUPATION: medical claims; works from home  PLOF: Independent  PATIENT GOALS: walk normally, reduced pain, and be able to play with her grandchildren  NEXT MD VISIT: none scheduled  OBJECTIVE:  Note: Objective measures were completed at Evaluation unless otherwise noted.  COGNITION: Overall cognitive status: Within functional limits for tasks assessed     SENSATION: Patient reports no numbness or tingling  POSTURE: decreased lumbar lordosis  PALPATION: Unable to be assessed at initial evaluation due to  time constraints  LUMBAR ROM:   AROM eval  Flexion 70; familiar pain and required UE support to return to neutral  Extension 10  Right lateral flexion   Left lateral flexion   Right rotation 50% limited; familiar pain   Left rotation 25% limited   (Blank rows = not tested)  LOWER EXTREMITY ROM:     Active  Right eval Right 11/02/23 Left eval  Hip flexion     Hip extension     Hip abduction     Hip adduction     Hip internal rotation     Hip external rotation     Knee flexion     Knee extension     Ankle dorsiflexion -7 -4 9  Ankle plantarflexion 33 35 49  Ankle inversion 11 35 24  Ankle eversion 10 20 15    (Blank rows = not tested)  LOWER EXTREMITY MMT:  unable to be assessed at initial evaluation due to time constraints   MMT Right eval Left eval  Hip flexion    Hip extension    Hip abduction    Hip adduction    Hip internal rotation    Hip external rotation    Knee flexion    Knee extension    Ankle dorsiflexion    Ankle plantarflexion    Ankle inversion    Ankle eversion     (Blank rows = not tested)  LUMBAR SPECIAL TESTS:  Unable to be assessed due to time constraints  GAIT: Assistive device utilized: None Level of assistance: Complete Independence Comments: decreased gait speed and stride length  TREATMENT DATE:  11/10/23 EXERCISE LOG  Exercise Repetitions and Resistance Comments  Nustep  L4 x 16 minutes   Rocker board  4 minutes each AP and lateral   Marching on foam  3 minutes Without UE support  Side stepping on foam  3.5 minutes  Intermittent UE support  Lateral step up and over  6" step x 3 minutes     Blank cell = exercise not performed today                                   11/02/23  EXERCISE LOG  Exercise Repetitions and Resistance Comments  Nustep  L4 x 18 minutes   Gastroc  stretch  4 x 30 seconds RLE only; long sitting  Rocker board  5 minutes  For ankle mobility   Seated hip ADD isometric  3 minutes w/ 5 second hold Ball squeeze       Blank cell = exercise not performed today                                    10/26/23 EXERCISE LOG  Exercise Repetitions and Resistance Comments  Nustep  L3 x 15 minutes   Rocker board  5 minutes    Blank cell = exercise not performed today  Manual Therapy Soft Tissue Mobilization: right fibularis, for improved soft tissue extensibility Joint Mobilizations: R subtalar, talocrural, and metatarsal, grade I-III for improved  Passive ROM: R ankle, all planes to tolerance  PATIENT EDUCATION:  Education details: plan of care, objective findings, and progress with physical therapy Person educated: Patient Education method: Explanation Education comprehension: verbalized understanding  HOME EXERCISE PROGRAM:   ASSESSMENT:  CLINICAL IMPRESSION: Patient was progressed with familiar interventions for improved ankle stability. She required minimal cueing with today's interventions for proper exercise performance. She experienced no increase in pain or discomfort with any of today's interventions. She reported feeling good upon the conclusion of treatment. She continues to require skilled physical therapy to address her remaining impairments to maximize her functional mobility.   PHYSICAL THERAPY DISCHARGE SUMMARY  Visits from Start of Care: 9  Current functional level related to goals / functional outcomes: Patient is being discharged due to patient request.    Remaining deficits: Pain, AROM, and gait deviations   Education / Equipment: HEP    Patient agrees to discharge. Patient goals were partially met. Patient is being discharged due to the patient's request.  Glendora Landsman, PT, DPT    OBJECTIVE IMPAIRMENTS: Abnormal gait, decreased activity tolerance, decreased balance, decreased mobility, difficulty walking,  decreased ROM, decreased strength, hypomobility, impaired flexibility, postural dysfunction, and pain.   ACTIVITY LIMITATIONS: lifting, bending, stairs, transfers, and locomotion level  PARTICIPATION LIMITATIONS: community activity  PERSONAL FACTORS: Past/current experiences, Time since onset of injury/illness/exacerbation, and 3+ comorbidities: Allergies, latex allergy , current smoker, and history of cancer  are also affecting patient's functional outcome.   REHAB POTENTIAL: Fair    CLINICAL DECISION MAKING: Evolving/moderate complexity  EVALUATION COMPLEXITY: Moderate   GOALS: Goals reviewed with patient? Yes  SHORT TERM GOALS: Target date: 10/05/23  Patient will be independent with her initial HEP. Baseline: Goal status: MET  2.  Patient will be able to demonstrate at least neutral right ankle dorsiflexion for improved gait mechanics. Baseline: see objective  Goal status: On going  3.  Patient will be able to complete  her daily activities without her familiar low back pain exceeding 8/10. Baseline:  Goal status: MET  LONG TERM GOALS: Target date: 10/26/23  Patient will be independent with her advanced HEP. Baseline: limited by pain Goal status: PARTIALLY MET  2.  Patient will improve her right ankle dorsiflexion to at least 5 degrees for improved right foot toe off. Baseline: see objective Goal status: NOT MET  3.  Patient will be able to demonstrate proper lifting mechanics to reduce her risk of reinjury. Baseline: narrow BOS with increased trunk flexion Goal status: NOT MET   4.  Patient will be able to complete her daily activities without her familiar low back pain exceeding 6/10. Baseline:  Goal status: NOT MET  5.  Patient will be able to ambulate with minimal to no significant gait deviations. Baseline: reduced stance time on RLE due to pain  Goal status: NOT MET  PLAN:  PT FREQUENCY: 2x/week  PT DURATION: 6 weeks  PLANNED INTERVENTIONS: 97164- PT  Re-evaluation, 97110-Therapeutic exercises, 97530- Therapeutic activity, 97112- Neuromuscular re-education, 97535- Self Care, 16109- Manual therapy, [phone]- Gait training, [phone]- Electrical stimulation (unattended), 97016- Vasopneumatic device, 97035- Ultrasound, Patient/Family education, Balance training, Stair training, Dry Needling, Joint mobilization, Spinal mobilization, Cryotherapy, and Moist heat.  PLAN FOR NEXT SESSION: NuStep, lower trunk rotations, manual therapy, isometrics, and modalities as needed   Lane Pinon, PT 11/10/2023, 2:51 PM

## 2023-11-10 NOTE — Progress Notes (Signed)
 VIALS MADE 11-10-23.EXP 11-09-24

## 2023-11-11 ENCOUNTER — Ambulatory Visit (INDEPENDENT_AMBULATORY_CARE_PROVIDER_SITE_OTHER): Payer: Self-pay

## 2023-11-11 DIAGNOSIS — J309 Allergic rhinitis, unspecified: Secondary | ICD-10-CM

## 2023-11-11 NOTE — Progress Notes (Signed)
 VIAL SET TWO MOLDS MADE 11-11-23. EXP 11-10-24

## 2023-11-12 DIAGNOSIS — J302 Other seasonal allergic rhinitis: Secondary | ICD-10-CM | POA: Diagnosis not present

## 2023-11-16 ENCOUNTER — Ambulatory Visit

## 2023-11-18 ENCOUNTER — Ambulatory Visit (INDEPENDENT_AMBULATORY_CARE_PROVIDER_SITE_OTHER): Payer: Self-pay

## 2023-11-18 DIAGNOSIS — J309 Allergic rhinitis, unspecified: Secondary | ICD-10-CM

## 2023-11-19 ENCOUNTER — Encounter

## 2023-11-23 ENCOUNTER — Encounter

## 2023-11-23 ENCOUNTER — Encounter: Payer: Self-pay | Admitting: Orthopaedic Surgery

## 2023-11-23 NOTE — Telephone Encounter (Signed)
See message.  Thanks.

## 2023-11-24 NOTE — Telephone Encounter (Signed)
 See message.

## 2023-11-25 ENCOUNTER — Ambulatory Visit: Payer: Medicaid Other | Admitting: Allergy & Immunology

## 2023-11-25 ENCOUNTER — Ambulatory Visit (INDEPENDENT_AMBULATORY_CARE_PROVIDER_SITE_OTHER): Admitting: Allergy & Immunology

## 2023-11-25 ENCOUNTER — Encounter: Payer: Self-pay | Admitting: Allergy & Immunology

## 2023-11-25 VITALS — BP 102/70 | HR 70 | Temp 98.4°F | Resp 16 | Wt 148.0 lb

## 2023-11-25 DIAGNOSIS — J310 Chronic rhinitis: Secondary | ICD-10-CM

## 2023-11-25 DIAGNOSIS — R053 Chronic cough: Secondary | ICD-10-CM | POA: Diagnosis not present

## 2023-11-25 DIAGNOSIS — J302 Other seasonal allergic rhinitis: Secondary | ICD-10-CM | POA: Diagnosis not present

## 2023-11-25 DIAGNOSIS — J3089 Other allergic rhinitis: Secondary | ICD-10-CM

## 2023-11-25 DIAGNOSIS — J309 Allergic rhinitis, unspecified: Secondary | ICD-10-CM

## 2023-11-25 DIAGNOSIS — Z888 Allergy status to other drugs, medicaments and biological substances status: Secondary | ICD-10-CM

## 2023-11-25 MED ORDER — AZELASTINE-FLUTICASONE 137-50 MCG/ACT NA SUSP: 2.0000 | Freq: Two times a day (BID) | NASAL | 5 refills | Status: AC

## 2023-11-25 NOTE — Patient Instructions (Addendum)
 1. Rhinitis medicamentosa with overlying perennial and seasonal allergic rhinitis - Previous testing showed: grasses, weeds, indoor molds and outdoor molds - Stop the current nose sprays and start Dymista two sprays per nostril twice daily.  2. Return in about 1 year (around 11/24/2024). You can have the follow up appointment with Dr. Idolina Maker or a Nurse Practicioner (our Nurse Practitioners are excellent and always have Physician oversight!).    Please inform us  of any Emergency Department visits, hospitalizations, or changes in symptoms. Call us  before going to the ED for breathing or allergy  symptoms since we might be able to fit you in for a sick visit. Feel free to contact us  anytime with any questions, problems, or concerns.  It was a pleasure to see you again today! I am glad that you could   Websites that have reliable patient information: 1. American Academy of Asthma, Allergy , and Immunology: www.aaaai.org 2. Food Allergy  Research and Education (FARE): foodallergy.org 3. Mothers of Asthmatics: http://www.asthmacommunitynetwork.org 4. [employer] of Allergy , Asthma, and Immunology: www.acaai.org      "Like" us  on Facebook and Instagram for our latest updates!      A healthy democracy works best when [employer] participate! Make sure you are registered to vote! If you have moved or changed any of your contact information, you will need to get this updated before voting! Scan the QR codes below to learn more!

## 2023-11-25 NOTE — Progress Notes (Signed)
 FOLLOW UP  Date of Service/Encounter:  11/25/23   Assessment:   Cough - likely from postnasal drip (resolved with adequate control of postnasal drip)   Rhinitis medicamentosa - improved   Perennial and seasonal allergic rhinitis (grasses, weeds, indoor molds and outdoor molds) - previously doing well on allergen immunotherapy  Recently broke ankle   Plan/Recommendations:   1. Rhinitis medicamentosa with overlying perennial and seasonal allergic rhinitis - Previous testing showed: grasses, weeds, indoor molds and outdoor molds - Stop the current nose sprays and start Dymista  two sprays per nostril twice daily.  2. Return in about 1 year (around 11/24/2024). You can have the follow up appointment with Dr. Idolina Maker or a Nurse Practicioner (our Nurse Practitioners are excellent and always have Physician oversight!).   Subjective:   Debbie Delgado is a 53 y.o. female presenting today for follow up of  Chief Complaint  Patient presents with   Follow-up        Allergic Rhinitis     Debbie Delgado has a history of the following: Patient Active Problem List   Diagnosis Date Noted   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-sided sciatica 08/25/2023   Closed displaced trimalleolar fracture of right lower leg 04/25/2023   Anemia 06/05/2017   Abnormal uterine bleeding (AUB) 06/05/2017    History obtained from: chart review and patient.  Discussed the use of AI scribe software for clinical note transcription with the patient and/or guardian, who gave verbal consent to proceed.  Debbie Delgado is a 53 y.o. female presenting for a follow up visit.  She was last seen via televisit in October 2024.  At that time, we continue with Flonase  and Nasonex.  We also continue with Astelin  as well as Singulair .  We did talk about allergy  shots for long-term control and she did decide to start continue with them.   She has been receiving allergy  shots weekly, but there was a period when she was  unable to continue due to a broken ankle sustained on April 24, 2023. The injury was severe, with her foot turning completely around. She did not undergo physical therapy and has since moved from [place] to [place] in February 2025. She resumed her allergy  shots as soon as she was able to.   Allergic Rhinitis Symptom History: She uses Restasis for dry eyes and occasionally uses a nasal spray. She is on azelastine  nasal spray but finds it ineffective. She is not taking montelukast  or any pills, only the nasal spray and allergy  shots. No recent sinus infections.   Debbie Delgado is on allergen immunotherapy. She receives two injections. Immunotherapy script #1 contains molds. She currently receives 0.62mL of the BLUE vial (1/100,000). Immunotherapy script #2 contains weeds and grasses. She currently receives 0.81mL of the BLUE vial (1/100,000). She started shots May of 2024 and not yet reached maintenance.   She is currently on Wegovy shots, which she started in February 2025, and has lost 18 pounds since then. The dosage of Wegovy is increased monthly, and she is currently on a 1 mg dose. She takes Phenergan for nausea associated with [id number]. She is concerned about the potential discontinuation of Wegovy due to Medicaid coverage. She has a history of heart surgeries and breast surgery, which she mentioned in the context of not wanting additional stress in her life.  Socially, she recently moved to a new house in [place] after leaving a long-term relationship due to domestic issues. She works from home and has support from her employer. She  has a dog and is renting a house at a reduced rate from a family acquaintance.   Otherwise, there have been no changes to her past medical history, surgical history, family history, or social history.    Review of systems otherwise negative other than that mentioned in the HPI.    Objective:   Blood pressure 102/70, pulse 70, temperature 98.4 F (36.9 C), resp. rate  16, weight 148 lb (67.1 kg), last menstrual period 05/22/2017, SpO2 94%. Body mass index is 28.9 kg/m.    Physical Exam Vitals reviewed.  Constitutional:      Appearance: She is well-developed.     Comments: Friendly.  HENT:     Head: Normocephalic and atraumatic.     Right Ear: Tympanic membrane, ear canal and external ear normal. No drainage, swelling or tenderness. Tympanic membrane is not injected, scarred, erythematous, retracted or bulging.     Left Ear: Tympanic membrane, ear canal and external ear normal. No drainage, swelling or tenderness. Tympanic membrane is not injected, scarred, erythematous, retracted or bulging.     Nose: Mucosal edema and rhinorrhea present. No nasal deformity or septal deviation.     Right Turbinates: Enlarged, swollen and pale.     Left Turbinates: Enlarged, swollen and pale.     Right Sinus: No maxillary sinus tenderness or frontal sinus tenderness.     Left Sinus: No maxillary sinus tenderness or frontal sinus tenderness.     Mouth/Throat:     Mouth: Mucous membranes are not pale and not dry.     Pharynx: Uvula midline.  Eyes:     General: Allergic shiner present.        Right eye: No discharge.        Left eye: No discharge.     Conjunctiva/sclera: Conjunctivae normal.     Right eye: Right conjunctiva is not injected. No chemosis.    Left eye: Left conjunctiva is not injected. No chemosis.    Pupils: Pupils are equal, round, and reactive to light.  Cardiovascular:     Rate and Rhythm: Normal rate and regular rhythm.     Heart sounds: Normal heart sounds.  Pulmonary:     Effort: Pulmonary effort is normal. No tachypnea, accessory muscle usage or respiratory distress.     Breath sounds: Normal breath sounds. No wheezing, rhonchi or rales.  Chest:     Chest wall: No tenderness.  Genitourinary:    Rectum: Guaiac result negative.  Lymphadenopathy:     Head:     Right side of head: No submandibular, tonsillar or occipital adenopathy.      Left side of head: No submandibular, tonsillar or occipital adenopathy.     Cervical: No cervical adenopathy.  Skin:    General: Skin is warm.     Capillary Refill: Capillary refill takes less than 2 seconds.     Coloration: Skin is not pale.     Findings: No abrasion, erythema, petechiae or rash. Rash is not papular, urticarial or vesicular.  Neurological:     Mental Status: She is alert.  Psychiatric:        Behavior: Behavior is cooperative.      Diagnostic studies: none      Drexel Gentles, MD  [facility] 

## 2023-11-26 ENCOUNTER — Encounter

## 2023-11-27 ENCOUNTER — Encounter: Payer: Self-pay | Admitting: Allergy & Immunology

## 2023-11-30 ENCOUNTER — Encounter

## 2023-12-02 ENCOUNTER — Ambulatory Visit (INDEPENDENT_AMBULATORY_CARE_PROVIDER_SITE_OTHER): Payer: Self-pay

## 2023-12-02 DIAGNOSIS — J309 Allergic rhinitis, unspecified: Secondary | ICD-10-CM

## 2023-12-09 ENCOUNTER — Ambulatory Visit (INDEPENDENT_AMBULATORY_CARE_PROVIDER_SITE_OTHER)

## 2023-12-09 DIAGNOSIS — J309 Allergic rhinitis, unspecified: Secondary | ICD-10-CM

## 2023-12-16 ENCOUNTER — Ambulatory Visit (INDEPENDENT_AMBULATORY_CARE_PROVIDER_SITE_OTHER)

## 2023-12-16 DIAGNOSIS — J309 Allergic rhinitis, unspecified: Secondary | ICD-10-CM | POA: Diagnosis not present

## 2023-12-22 ENCOUNTER — Other Ambulatory Visit: Payer: Self-pay | Admitting: Allergy & Immunology

## 2023-12-23 ENCOUNTER — Ambulatory Visit (INDEPENDENT_AMBULATORY_CARE_PROVIDER_SITE_OTHER): Payer: Self-pay

## 2023-12-23 DIAGNOSIS — J309 Allergic rhinitis, unspecified: Secondary | ICD-10-CM

## 2023-12-30 ENCOUNTER — Ambulatory Visit (INDEPENDENT_AMBULATORY_CARE_PROVIDER_SITE_OTHER): Payer: Self-pay

## 2023-12-30 DIAGNOSIS — J309 Allergic rhinitis, unspecified: Secondary | ICD-10-CM | POA: Diagnosis not present

## 2024-01-06 ENCOUNTER — Ambulatory Visit (INDEPENDENT_AMBULATORY_CARE_PROVIDER_SITE_OTHER): Payer: Self-pay

## 2024-01-06 DIAGNOSIS — J309 Allergic rhinitis, unspecified: Secondary | ICD-10-CM | POA: Diagnosis not present

## 2024-01-13 ENCOUNTER — Ambulatory Visit (INDEPENDENT_AMBULATORY_CARE_PROVIDER_SITE_OTHER): Payer: Self-pay

## 2024-01-13 DIAGNOSIS — J309 Allergic rhinitis, unspecified: Secondary | ICD-10-CM

## 2024-01-20 ENCOUNTER — Ambulatory Visit (INDEPENDENT_AMBULATORY_CARE_PROVIDER_SITE_OTHER): Payer: Self-pay

## 2024-01-20 DIAGNOSIS — J309 Allergic rhinitis, unspecified: Secondary | ICD-10-CM

## 2024-01-27 ENCOUNTER — Ambulatory Visit (INDEPENDENT_AMBULATORY_CARE_PROVIDER_SITE_OTHER): Payer: Self-pay

## 2024-01-27 DIAGNOSIS — J309 Allergic rhinitis, unspecified: Secondary | ICD-10-CM | POA: Diagnosis not present

## 2024-02-03 ENCOUNTER — Ambulatory Visit (INDEPENDENT_AMBULATORY_CARE_PROVIDER_SITE_OTHER): Payer: Self-pay

## 2024-02-03 DIAGNOSIS — J309 Allergic rhinitis, unspecified: Secondary | ICD-10-CM

## 2024-02-10 ENCOUNTER — Ambulatory Visit (INDEPENDENT_AMBULATORY_CARE_PROVIDER_SITE_OTHER): Payer: Self-pay

## 2024-02-10 DIAGNOSIS — J309 Allergic rhinitis, unspecified: Secondary | ICD-10-CM

## 2024-02-17 ENCOUNTER — Ambulatory Visit (INDEPENDENT_AMBULATORY_CARE_PROVIDER_SITE_OTHER): Payer: Self-pay

## 2024-02-17 DIAGNOSIS — J309 Allergic rhinitis, unspecified: Secondary | ICD-10-CM | POA: Diagnosis not present

## 2024-02-24 ENCOUNTER — Ambulatory Visit (INDEPENDENT_AMBULATORY_CARE_PROVIDER_SITE_OTHER)

## 2024-02-24 DIAGNOSIS — J309 Allergic rhinitis, unspecified: Secondary | ICD-10-CM

## 2024-03-02 ENCOUNTER — Ambulatory Visit (INDEPENDENT_AMBULATORY_CARE_PROVIDER_SITE_OTHER): Payer: Self-pay

## 2024-03-02 DIAGNOSIS — J309 Allergic rhinitis, unspecified: Secondary | ICD-10-CM

## 2024-03-07 ENCOUNTER — Ambulatory Visit (INDEPENDENT_AMBULATORY_CARE_PROVIDER_SITE_OTHER): Payer: Self-pay

## 2024-03-07 DIAGNOSIS — J309 Allergic rhinitis, unspecified: Secondary | ICD-10-CM | POA: Diagnosis not present

## 2024-03-08 ENCOUNTER — Ambulatory Visit: Admitting: Orthopaedic Surgery

## 2024-03-08 ENCOUNTER — Other Ambulatory Visit: Payer: Self-pay

## 2024-03-08 DIAGNOSIS — M654 Radial styloid tenosynovitis [de Quervain]: Secondary | ICD-10-CM | POA: Diagnosis not present
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
## 2024-03-14 ENCOUNTER — Other Ambulatory Visit: Payer: Self-pay | Admitting: Medical Genetics

## 2024-03-14 ENCOUNTER — Encounter: Payer: Self-pay | Admitting: Orthopaedic Surgery

## 2024-03-15 ENCOUNTER — Encounter: Payer: Self-pay | Admitting: Orthopaedic Surgery

## 2024-03-16 ENCOUNTER — Ambulatory Visit (INDEPENDENT_AMBULATORY_CARE_PROVIDER_SITE_OTHER): Payer: Self-pay

## 2024-03-16 DIAGNOSIS — J309 Allergic rhinitis, unspecified: Secondary | ICD-10-CM

## 2024-03-17 ENCOUNTER — Other Ambulatory Visit: Payer: Self-pay

## 2024-03-17 ENCOUNTER — Encounter (HOSPITAL_BASED_OUTPATIENT_CLINIC_OR_DEPARTMENT_OTHER): Payer: Self-pay | Admitting: Orthopaedic Surgery

## 2024-03-23 ENCOUNTER — Ambulatory Visit (HOSPITAL_BASED_OUTPATIENT_CLINIC_OR_DEPARTMENT_OTHER): Admitting: Anesthesiology

## 2024-03-23 ENCOUNTER — Telehealth: Payer: Self-pay | Admitting: Orthopaedic Surgery

## 2024-03-23 ENCOUNTER — Ambulatory Visit (HOSPITAL_BASED_OUTPATIENT_CLINIC_OR_DEPARTMENT_OTHER)
Admission: RE | Admit: 2024-03-23 | Discharge: 2024-03-23 | Disposition: A | Discharge status: Home / Self Care | Attending: Orthopaedic Surgery | Admitting: Orthopaedic Surgery

## 2024-03-23 ENCOUNTER — Other Ambulatory Visit: Payer: Self-pay | Admitting: Physician Assistant

## 2024-03-23 ENCOUNTER — Other Ambulatory Visit: Payer: Self-pay

## 2024-03-23 ENCOUNTER — Ambulatory Visit (INDEPENDENT_AMBULATORY_CARE_PROVIDER_SITE_OTHER)

## 2024-03-23 ENCOUNTER — Encounter (HOSPITAL_BASED_OUTPATIENT_CLINIC_OR_DEPARTMENT_OTHER)
Admission: RE | Disposition: A | Discharge status: Home / Self Care | Payer: Self-pay | Source: Home / Self Care | Attending: Orthopaedic Surgery

## 2024-03-23 ENCOUNTER — Encounter (HOSPITAL_BASED_OUTPATIENT_CLINIC_OR_DEPARTMENT_OTHER): Payer: Self-pay | Admitting: Orthopaedic Surgery

## 2024-03-23 DIAGNOSIS — J309 Allergic rhinitis, unspecified: Secondary | ICD-10-CM | POA: Diagnosis not present

## 2024-03-23 DIAGNOSIS — F17210 Nicotine dependence, cigarettes, uncomplicated: Secondary | ICD-10-CM | POA: Insufficient documentation

## 2024-03-23 DIAGNOSIS — M654 Radial styloid tenosynovitis [de Quervain]: Secondary | ICD-10-CM

## 2024-03-23 HISTORY — PX: REPAIR EXTENSOR TENDON: SHX5382

## 2024-03-23 HISTORY — DX: Malignant neoplasm of cervix uteri, unspecified: C53.9

## 2024-03-23 SURGERY — REPAIR, TENDON, EXTENSOR
Anesthesia: General | Site: Wrist | Laterality: Left

## 2024-03-23 MED ORDER — AMISULPRIDE (ANTIEMETIC) 5 MG/2ML IV SOLN: 10.0000 mg | Freq: Once | INTRAVENOUS | Status: DC | PRN

## 2024-03-23 MED ORDER — CEFAZOLIN SODIUM-DEXTROSE 2-3 GM-%(50ML) IV SOLR: INTRAVENOUS | Status: DC | PRN

## 2024-03-23 MED ORDER — DEXMEDETOMIDINE HCL IN NACL 80 MCG/20ML IV SOLN
INTRAVENOUS | Status: DC | PRN
  Administered 2024-03-23: 4 ug via INTRAVENOUS

## 2024-03-23 MED ORDER — 0.9 % SODIUM CHLORIDE (POUR BTL) OPTIME
TOPICAL | Status: DC | PRN
Start: 2024-03-23 — End: 2024-03-23
  Administered 2024-03-23: 120 mL
  Administered 2024-03-23: 1000 mL

## 2024-03-23 MED ORDER — ONDANSETRON HCL 4 MG PO TABS: 4.0000 mg | ORAL_TABLET | Freq: Three times a day (TID) | ORAL | 0 refills | Status: AC | PRN

## 2024-03-23 MED ORDER — DEXAMETHASONE SODIUM PHOSPHATE 10 MG/ML IJ SOLN
INTRAMUSCULAR | Status: DC | PRN
  Administered 2024-03-23: 5 mg via INTRAVENOUS

## 2024-03-23 MED ORDER — EPHEDRINE SULFATE-NACL 50-0.9 MG/10ML-% IV SOSY
PREFILLED_SYRINGE | INTRAVENOUS | Status: DC | PRN
Start: 2024-03-23 — End: 2024-03-23
  Administered 2024-03-23: 5 mg via INTRAVENOUS

## 2024-03-23 MED ORDER — FENTANYL CITRATE (PF) 100 MCG/2ML IJ SOLN
25.0000 ug | INTRAMUSCULAR | Status: DC | PRN
  Administered 2024-03-23 (×3): 50 ug via INTRAVENOUS

## 2024-03-23 MED ORDER — DEXMEDETOMIDINE HCL IN NACL 80 MCG/20ML IV SOLN
INTRAVENOUS | Status: AC
  Filled 2024-03-23: qty 40

## 2024-03-23 MED ORDER — BUPIVACAINE HCL (PF) 0.25 % IJ SOLN
INTRAMUSCULAR | Status: DC | PRN
  Administered 2024-03-23: 9 mL

## 2024-03-23 MED ORDER — FENTANYL CITRATE (PF) 100 MCG/2ML IJ SOLN
INTRAMUSCULAR | Status: AC
  Filled 2024-03-23: qty 2

## 2024-03-23 MED ORDER — CEFAZOLIN SODIUM-DEXTROSE 2-4 GM/100ML-% IV SOLN
INTRAVENOUS | Status: AC
  Filled 2024-03-23: qty 100

## 2024-03-23 MED ORDER — OXYCODONE HCL 5 MG PO TABS
ORAL_TABLET | ORAL | Status: AC
  Filled 2024-03-23: qty 1

## 2024-03-23 MED ORDER — ONDANSETRON HCL 4 MG/2ML IJ SOLN: 4.0000 mg | Freq: Once | INTRAMUSCULAR | Status: DC | PRN

## 2024-03-23 MED ORDER — LIDOCAINE 2% (20 MG/ML) 5 ML SYRINGE
INTRAMUSCULAR | Status: DC | PRN
  Administered 2024-03-23: 80 mg via INTRAVENOUS

## 2024-03-23 MED ORDER — CEFAZOLIN SODIUM-DEXTROSE 2-4 GM/100ML-% IV SOLN
2.0000 g | INTRAVENOUS | Status: AC
  Administered 2024-03-23: 2 g via INTRAVENOUS

## 2024-03-23 MED ORDER — KETOROLAC TROMETHAMINE 30 MG/ML IJ SOLN
INTRAMUSCULAR | Status: DC | PRN
  Administered 2024-03-23: 15 mg via INTRAVENOUS

## 2024-03-23 MED ORDER — LACTATED RINGERS IV SOLN: INTRAVENOUS | Status: DC

## 2024-03-23 MED ORDER — ONDANSETRON HCL 4 MG/2ML IJ SOLN
INTRAMUSCULAR | Status: DC | PRN
  Administered 2024-03-23: 4 mg via INTRAVENOUS

## 2024-03-23 MED ORDER — OXYCODONE HCL 5 MG PO TABS
5.0000 mg | ORAL_TABLET | Freq: Once | ORAL | Status: AC | PRN
  Administered 2024-03-23: 5 mg via ORAL

## 2024-03-23 MED ORDER — FENTANYL CITRATE (PF) 100 MCG/2ML IJ SOLN
INTRAMUSCULAR | Status: DC | PRN
  Administered 2024-03-23: 75 ug via INTRAVENOUS
  Administered 2024-03-23 (×2): 50 ug via INTRAVENOUS
  Administered 2024-03-23: 25 ug via INTRAVENOUS

## 2024-03-23 MED ORDER — FENTANYL CITRATE (PF) 100 MCG/2ML IJ SOLN
INTRAMUSCULAR | Status: AC
Start: 2024-03-23 — End: 2024-03-23
  Filled 2024-03-23: qty 2

## 2024-03-23 MED ORDER — PROPOFOL 500 MG/50ML IV EMUL
INTRAVENOUS | Status: AC
  Filled 2024-03-23: qty 50

## 2024-03-23 MED ORDER — PROPOFOL 10 MG/ML IV BOLUS
INTRAVENOUS | Status: DC | PRN
  Administered 2024-03-23: 200 mg via INTRAVENOUS

## 2024-03-23 MED ORDER — OXYCODONE HCL 5 MG PO TABS: 5.0000 mg | ORAL_TABLET | Freq: Three times a day (TID) | ORAL | 0 refills | Status: DC | PRN

## 2024-03-23 SURGICAL SUPPLY — 55 items
BAND RUBBER #18 3X1/16 STRL (MISCELLANEOUS) ×2 IMPLANT
BLADE MINI RND TIP GREEN BEAV (BLADE) ×1 IMPLANT
BLADE SURG 15 STRL LF DISP TIS (BLADE) ×2 IMPLANT
BNDG COMPR ESMARK 4X3 LF (GAUZE/BANDAGES/DRESSINGS) ×1 IMPLANT
BNDG ELASTIC 2INX 5YD STR LF (GAUZE/BANDAGES/DRESSINGS) IMPLANT
BNDG ELASTIC 3INX 5YD STR LF (GAUZE/BANDAGES/DRESSINGS) ×1 IMPLANT
BNDG PLASTER X FAST 3X3 WHT LF (CAST SUPPLIES) IMPLANT
BRUSH SCRUB EZ PLAIN DRY (MISCELLANEOUS) ×1 IMPLANT
CORD BIPOLAR FORCEPS 12FT (ELECTRODE) ×1 IMPLANT
COVER BACK TABLE 60X90IN (DRAPES) ×1 IMPLANT
COVER MAYO STAND STRL (DRAPES) ×1 IMPLANT
CUFF TOURN SGL QUICK 18X4 (TOURNIQUET CUFF) IMPLANT
DRAPE EXTREMITY T 121X128X90 (DISPOSABLE) ×1 IMPLANT
DRAPE OEC MINIVIEW 54X84 (DRAPES) ×1 IMPLANT
DRAPE SURG 17X23 STRL (DRAPES) ×1 IMPLANT
GAUZE 4X4 16PLY ~~LOC~~+RFID DBL (SPONGE) IMPLANT
GAUZE SPONGE 4X4 12PLY STRL (GAUZE/BANDAGES/DRESSINGS) ×1 IMPLANT
GAUZE XEROFORM 1X8 LF (GAUZE/BANDAGES/DRESSINGS) ×1 IMPLANT
GLOVE BIOGEL PI IND STRL 7.5 (GLOVE) ×1 IMPLANT
GLOVE ECLIPSE 7.0 STRL STRAW (GLOVE) ×1 IMPLANT
GLOVE INDICATOR 7.0 STRL GRN (GLOVE) ×1 IMPLANT
GLOVE SURG SYN 7.5 PF PI (GLOVE) ×1 IMPLANT
GOWN STRL REUS W/ TWL LRG LVL3 (GOWN DISPOSABLE) ×1 IMPLANT
GOWN STRL REUS W/ TWL XL LVL3 (GOWN DISPOSABLE) ×1 IMPLANT
GOWN STRL SURGICAL XL XLNG (GOWN DISPOSABLE) ×2 IMPLANT
NDL HYPO 25X1 1.5 SAFETY (NEEDLE) IMPLANT
NEEDLE HYPO 25X1 1.5 SAFETY (NEEDLE) ×1 IMPLANT
NS IRRIG 1000ML POUR BTL (IV SOLUTION) ×1 IMPLANT
PACK BASIN DAY SURGERY FS (CUSTOM PROCEDURE TRAY) ×1 IMPLANT
PAD CAST 3X4 CTTN HI CHSV (CAST SUPPLIES) ×1 IMPLANT
PADDING CAST ABS COTTON 3X4 (CAST SUPPLIES) IMPLANT
PADDING CAST ABS COTTON 4X4 ST (CAST SUPPLIES) ×1 IMPLANT
PADDING UNDERCAST 2X4 STRL (CAST SUPPLIES) IMPLANT
SHEET MEDIUM DRAPE 40X70 STRL (DRAPES) ×1 IMPLANT
SLEEVE SCD COMPRESS KNEE MED (STOCKING) ×1 IMPLANT
SPIKE FLUID TRANSFER (MISCELLANEOUS) IMPLANT
SPLINT FIBERGLASS 3X35 (CAST SUPPLIES) IMPLANT
SPLINT UNIVERSAL RIGHT (SOFTGOODS) IMPLANT
SPLINT WRIST 10 LEFT UNV (SOFTGOODS) IMPLANT
STOCKINETTE 4X48 STRL (DRAPES) ×1 IMPLANT
SUCTION TUBE FRAZIER 10FR DISP (SUCTIONS) ×1 IMPLANT
SUT ETHIBOND 2-0 V-5 NDL (SUTURE) ×1 IMPLANT
SUT ETHIBOND 2-0 V-5 NEEDLE (SUTURE) IMPLANT
SUT ETHILON 3 0 PS 1 (SUTURE) ×1 IMPLANT
SUT ETHILON 4 0 PS 2 18 (SUTURE) IMPLANT
SUT PROLENE 5 0 PS 2 (SUTURE) IMPLANT
SUT VIC AB 2-0 SH 27XBRD (SUTURE) ×1 IMPLANT
SUT VICRYL 0 SH 27 (SUTURE) IMPLANT
SUTURE FIBERWR #2 38 T-5 BLUE (SUTURE) IMPLANT
SYR BULB EAR ULCER 3OZ GRN STR (SYRINGE) ×1 IMPLANT
SYR CONTROL 10ML LL (SYRINGE) IMPLANT
TOWEL GREEN STERILE FF (TOWEL DISPOSABLE) ×2 IMPLANT
TRAY DSU PREP LF (CUSTOM PROCEDURE TRAY) ×1 IMPLANT
TUBE CONNECTING 20X1/4 (TUBING) ×1 IMPLANT
UNDERPAD 30X36 HEAVY ABSORB (UNDERPADS AND DIAPERS) ×1 IMPLANT

## 2024-03-23 NOTE — Transfer of Care (Signed)
 Immediate Anesthesia Transfer of Care Note  Patient: Debbie Delgado  Procedure(s) Performed: Left Wrist Tenosynovectomy (Left: Wrist)  Patient Location: PACU  Anesthesia Type:General  Level of Consciousness: awake, alert , oriented, and patient cooperative  Airway & Oxygen Therapy: Patient Spontanous Breathing  Post-op Assessment: Report given to RN and Post -op Vital signs reviewed and stable  Post vital signs: Reviewed and stable  Last Vitals:  Vitals Value Taken Time  BP 111/70 03/23/24 14:06  Temp 36.3 C 03/23/24 14:06  Pulse 63 03/23/24 14:12  Resp 16 03/23/24 14:12  SpO2 95 % 03/23/24 14:12  Vitals shown include unfiled device data.  Last Pain:  Vitals:   03/23/24 1026  TempSrc: Temporal  PainSc: 5       Patients Stated Pain Goal: 3 (03/23/24 1026)  Complications: No notable events documented.

## 2024-03-23 NOTE — Anesthesia Preprocedure Evaluation (Addendum)
 Anesthesia Evaluation  Patient identified by MRN, date of birth, ID band Patient awake    Reviewed: Allergy  & Precautions, NPO status , Patient's Chart, lab work & pertinent test results  History of Anesthesia Complications (+) PROLONGED EMERGENCE and history of anesthetic complications  Airway Mallampati: I  TM Distance: >3 FB Neck ROM: Full    Dental  (+) Teeth Intact, Dental Advisory Given   Pulmonary Current Smoker and Patient abstained from smoking.   Pulmonary exam normal breath sounds clear to auscultation       Cardiovascular Exercise Tolerance: Good (-) hypertensionnegative cardio ROS Normal cardiovascular exam Rhythm:Regular Rate:Normal     Neuro/Psych  Neuromuscular disease  negative psych ROS   GI/Hepatic negative GI ROS, Neg liver ROS,,,  Endo/Other  negative endocrine ROS    Renal/GU negative Renal ROS     Musculoskeletal negative musculoskeletal ROS (+)  left wrist Tenosynovitis, de Quervain   Abdominal   Peds  Hematology negative hematology ROS (+)   Anesthesia Other Findings Day of surgery medications reviewed with the patient.  Reproductive/Obstetrics Cervical cancer                              Anesthesia Physical Anesthesia Plan  ASA: 2  Anesthesia Plan: General   Post-op Pain Management: Toradol  IV (intra-op)*   Induction: Intravenous  PONV Risk Score and Plan: 2 and Midazolam , Dexamethasone  and Ondansetron   Airway Management Planned: LMA  Additional Equipment:   Intra-op Plan:   Post-operative Plan: Extubation in OR  Informed Consent: I have reviewed the patients History and Physical, chart, labs and discussed the procedure including the risks, benefits and alternatives for the proposed anesthesia with the patient or authorized representative who has indicated his/her understanding and acceptance.     Dental advisory given  Plan Discussed with:  CRNA  Anesthesia Plan Comments:          Anesthesia Quick Evaluation

## 2024-03-23 NOTE — Telephone Encounter (Signed)
 Pt submitted medical release form, FMLA, and $20.00 payment

## 2024-03-23 NOTE — Anesthesia Postprocedure Evaluation (Signed)
 Anesthesia Post Note  Patient: Debbie Delgado  Procedure(s) Performed: Left Wrist Tenosynovectomy (Left: Wrist)     Patient location during evaluation: PACU Anesthesia Type: General Level of consciousness: awake and alert Pain management: pain level controlled Vital Signs Assessment: post-procedure vital signs reviewed and stable Respiratory status: spontaneous breathing, nonlabored ventilation and respiratory function stable Cardiovascular status: blood pressure returned to baseline and stable Postop Assessment: no apparent nausea or vomiting Anesthetic complications: no   No notable events documented.  Last Vitals:  Vitals:   03/23/24 1448 03/23/24 1500  BP: 123/72 105/61  Pulse: (!) 58 (!) 52  Resp: (!) 21 15  Temp:    SpO2: 96% 96%    Last Pain:  Vitals:   03/23/24 1505  TempSrc:   PainSc: 6                  Garnette FORBES Skillern

## 2024-03-23 NOTE — Anesthesia Procedure Notes (Signed)
 Procedure Name: LMA Insertion Date/Time: 03/23/2024 1:17 PM  Performed by: Donnell Berwyn SQUIBB, CRNAPre-anesthesia Checklist: Patient identified, Emergency Drugs available, Suction available, Patient being monitored and Timeout performed Patient Re-evaluated:Patient Re-evaluated prior to induction Oxygen Delivery Method: Circle system utilized Preoxygenation: Pre-oxygenation with 100% oxygen Induction Type: IV induction Ventilation: Mask ventilation without difficulty LMA: LMA inserted LMA Size: 4.0 Number of attempts: 1 Placement Confirmation: positive ETCO2 and breath sounds checked- equal and bilateral Tube secured with: Tape Dental Injury: Teeth and Oropharynx as per pre-operative assessment

## 2024-03-23 NOTE — H&P (Signed)
 PREOPERATIVE H&P  Chief Complaint: left wrist Tenosynovitis, de Quervain  HPI: Debbie Delgado is a 53 y.o. female who presents for surgical treatment of left wrist Tenosynovitis, de Quervain.  She denies any changes in medical history.  Past Surgical History:  Procedure Laterality Date   BREAST BIOPSY Right 12/08/2022   MM RT BREAST BX W LOC DEV 1ST LESION IMAGE BX SPEC STEREO GUIDE 12/08/2022 [facility]   BREAST BIOPSY  02/16/2023   MM RT RADIOACTIVE SEED LOC MAMMO GUIDE 02/16/2023 [facility]   BREAST LUMPECTOMY WITH RADIOACTIVE SEED LOCALIZATION Right 02/17/2023   Procedure: RIGHT BREAST LUMPECTOMY WITH RADIOACTIVE SEED LOCALIZATION;  Surgeon: Vanderbilt Ned, MD;  Location: [facility];  Service: General;  Laterality: Right;   CARPAL TUNNEL RELEASE     ORIF ANKLE FRACTURE Right 05/06/2023   Procedure: OPEN REDUCTION INTERNAL FIXATION (ORIF) RIGHT TRIMALLEOLAR FRACTURE;  Surgeon: Jerri Kay HERO, MD;  Location: [facility];  Service: Orthopedics;  Laterality: Right;  block   TONSILLECTOMY     Social History   Socioeconomic History   Marital status: Divorced    Spouse name: Not on file   Number of children: Not on file   Years of education: Not on file   Highest education level: Not on file  Occupational History   Not on file  Tobacco Use   Smoking status: Every Day    Current packs/day: 0.50    Types: Cigarettes   Smokeless tobacco: Never   Tobacco comments:    1/2 PPD  Substance and Sexual Activity   Alcohol use: Yes    Comment: occasionally   Drug use: No   Sexual activity: Not on file  Other Topics Concern   Not on file  Social History Narrative   Not on file   Social Drivers of Health   Financial Resource Strain: Not on file  Food Insecurity: Not on file  Transportation Needs: Not on file  Physical Activity: Not on file  Stress: No Stress Concern Present (04/16/2021)   Received from [facility]  of Occupational Health - Occupational Stress Questionnaire    Feeling of Stress : Not at all  Social Connections: Unknown (12/06/2021)   Received from [employer]   Social Network    Social Network: Not on file   Family History  Problem Relation Age of Onset   Allergic rhinitis Sister    Breast cancer Maternal Grandmother    Allergic rhinitis Brother    Angioedema Neg Hx    Asthma Neg Hx    Atopy Neg Hx    Eczema Neg Hx    Immunodeficiency Neg Hx    Urticaria Neg Hx    Allergies  Allergen Reactions   Bupropion Other (See Comments)    Severe dry mouth    Fluoxetine Other (See Comments)    Hot flashes     Venlafaxine Hcl Other (See Comments)    Hot flashes    Latex Hives   Tape    Tylenol  [Acetaminophen ] Nausea And Vomiting    I can't take tylenol  NVD   Codeine Itching and Nausea And Vomiting   Prior to Admission medications   Medication Sig Start Date End Date Taking? Authorizing Provider  ondansetron  (ZOFRAN -ODT) 4 MG disintegrating tablet Take 4 mg by mouth every 8 (eight) hours as needed. 11/20/23  Yes [provider]  Prenatal Vit-Fe Fumarate-FA (PRENATAL MULTIVITAMIN) TABS tablet Take 1 tablet by mouth daily at 12 noon.  Yes [provider]  aspirin  81 MG chewable tablet Chew by mouth daily. Patient not taking: Reported on 11/25/2023    [provider]  Azelastine -Fluticasone  (DYMISTA ) 137-50 MCG/ACT SUSP Place 2 sprays into both nostrils in the morning and at bedtime. 11/25/23   Iva Marty Saltness, MD  cycloSPORINE (RESTASIS) 0.05 % ophthalmic emulsion Place 1 drop into both eyes 2 (two) times daily.    [provider]  EPINEPHrine  (EPIPEN  2-PAK) 0.3 mg/0.3 mL IJ SOAJ injection Inject 0.3 mg into the muscle as needed for anaphylaxis. 12/05/22   Iva Marty Saltness, MD  gabapentin  (NEURONTIN ) 100 MG capsule Take 1 capsule (100 mg total) by mouth 3 (three) times daily as needed. 06/05/23   Jule Ronal CROME, PA-C  gabapentin   (NEURONTIN ) 100 MG capsule Take 1-3 capsules (100-300 mg total) by mouth at bedtime as needed. 08/14/23   Jerri Kay HERO, MD  gabapentin  (NEURONTIN ) 300 MG capsule Take 1 capsule (300 mg total) by mouth 2 (two) times daily. 08/25/23   Jerri Kay HERO, MD  ibuprofen  (ADVIL ) 800 MG tablet Take 1 tablet (800 mg total) by mouth every 8 (eight) hours as needed. 05/20/23   Jerri Kay HERO, MD  methocarbamol  (ROBAXIN ) 750 MG tablet Take 1 tablet (750 mg total) by mouth 2 (two) times daily as needed for muscle spasms. 08/14/23   Jerri Kay HERO, MD  methocarbamol  (ROBAXIN -750) 750 MG tablet Take 1 tablet (750 mg total) by mouth 2 (two) times daily as needed for muscle spasms. 05/29/23   Jule Ronal CROME, PA-C  ondansetron  (ZOFRAN ) 4 MG tablet Take 1 tablet (4 mg total) by mouth every 8 (eight) hours as needed for nausea or vomiting. 05/04/23   Jule Ronal CROME, PA-C  ondansetron  (ZOFRAN ) 4 MG tablet Take 1 tablet (4 mg total) by mouth every 8 (eight) hours as needed for nausea or vomiting. 09/08/23   Jule Ronal CROME, PA-C  ondansetron  (ZOFRAN ) 4 MG tablet Take 1 tablet (4 mg total) by mouth every 8 (eight) hours as needed for nausea or vomiting. 03/23/24   Jule Ronal CROME, PA-C  oxyCODONE  (ROXICODONE ) 5 MG immediate release tablet Take 1 tablet (5 mg total) by mouth every 8 (eight) hours as needed. 03/23/24 03/23/25  Jule Ronal CROME, PA-C  WEGOVY 0.25 MG/0.5ML SOAJ INJECT 1/2 (ONE-HALF) ML ONCE A WEEK 09/21/23   [provider]     Positive ROS: All other systems have been reviewed and were otherwise negative with the exception of those mentioned in the HPI and as above.  Physical Exam: General: Alert, no acute distress Cardiovascular: No pedal edema Respiratory: No cyanosis, no use of accessory musculature GI: abdomen soft Skin: No lesions in the area of chief complaint Neurologic: Sensation intact distally Psychiatric: Patient is competent for consent with normal mood and affect Lymphatic: no  lymphedema  MUSCULOSKELETAL: exam stable  Assessment: left wrist Tenosynovitis, de Quervain  Plan: Plan for Procedure(s): REPAIR, TENDON, EXTENSOR  The risks benefits and alternatives were discussed with the patient including but not limited to the risks of nonoperative treatment, versus surgical intervention including infection, bleeding, nerve injury,  blood clots, cardiopulmonary complications, morbidity, mortality, among others, and they were willing to proceed.   Ozell Jerri, MD 03/23/2024 9:57 AM

## 2024-03-23 NOTE — Discharge Instructions (Addendum)
 Postoperative instructions:  Dressing instructions: Keep your dressing and/or splint clean and dry at all times.  It will be removed at your first post-operative appointment.  Your stitches and/or staples will be removed at this visit.  Incision instructions:  Do not soak your incision for 3 weeks after surgery.  If the incision gets wet, pat dry and do not scrub the incision.  Pain control:  You have been given a prescription to be taken as directed for post-operative pain control.  In addition, elevate the operative extremity above the heart at all times to prevent swelling and throbbing pain.  Take over-the-counter Colace, 100mg  by mouth twice a day while taking narcotic pain medications to help prevent constipation.  Follow up appointments: 1) 14 days for suture removal and wound check. 2) Dr. Jerri as scheduled.   -------------------------------------------------------------------------------------------------------------  After Surgery Pain Control:  After your surgery, post-surgical discomfort or pain is likely. This discomfort can last several days to a few weeks. At certain times of the day your discomfort may be more intense.  Did you receive a nerve block?  A nerve block can provide pain relief for one hour to two days after your surgery. As long as the nerve block is working, you will experience little or no sensation in the area the surgeon operated on.  As the nerve block wears off, you will begin to experience pain or discomfort. It is very important that you begin taking your prescribed pain medication before the nerve block fully wears off. Treating your pain at the first sign of the block wearing off will ensure your pain is better controlled and more tolerable when full-sensation returns. Do not wait until the pain is intolerable, as the medicine will be less effective. It is better to treat pain in advance than to try and catch up.  General Anesthesia:  If you did not  receive a nerve block during your surgery, you will need to start taking your pain medication shortly after your surgery and should continue to do so as prescribed by your surgeon.  Pain Medication:  Most commonly we prescribe Vicodin and Percocet for post-operative pain. Both of these medications contain a combination of acetaminophen  (Tylenol ) and a narcotic to help control pain.   It takes between 30 and 45 minutes before pain medication starts to work. It is important to take your medication before your pain level gets too intense.   Nausea is a common side effect of many pain medications. You will want to eat something before taking your pain medicine to help prevent nausea.   If you are taking a prescription pain medication that contains acetaminophen , we recommend that you do not take additional over the counter acetaminophen  (Tylenol ).  Other pain relieving options:   Using a cold pack to ice the affected area a few times a day (15 to 20 minutes at a time) can help to relieve pain, reduce swelling and bruising.   Elevation of the affected area can also help to reduce pain and swelling.  Per [facility] clinic policy, our goal is ensure optimal postoperative pain control with a multimodal pain management strategy. For all [facility] patients, our goal is to wean post-operative narcotic medications by 6 weeks post-operatively. If this is not possible due to utilization of pain medication prior to surgery, your [facility] doctor will support your acute post-operative pain control for the first 6 weeks postoperatively, with a plan to transition you back to your primary pain team following that. [facility]  will work to ensure a smooth handoff.  May take oxycodone  after 11pm. May take ibuprofen  after 10 pm.   Post Anesthesia Home Care Instructions  Activity: Get plenty of rest for the remainder of the day. A responsible individual must stay with you for 24 hours following the procedure.  For  the next 24 hours, DO NOT: -Drive a car -Advertising copywriter -Drink alcoholic beverages -Take any medication unless instructed by your physician -Make any legal decisions or sign important papers.  Meals: Start with liquid foods such as gelatin or soup. Progress to regular foods as tolerated. Avoid greasy, spicy, heavy foods. If nausea and/or vomiting occur, drink only clear liquids until the nausea and/or vomiting subsides. Call your physician if vomiting continues.  Special Instructions/Symptoms: Your throat may feel dry or sore from the anesthesia or the breathing tube placed in your throat during surgery. If this causes discomfort, gargle with warm salt water. The discomfort should disappear within 24 hours.  If you had a scopolamine patch placed behind your ear for the management of post- operative nausea and/or vomiting:  1. The medication in the patch is effective for 72 hours, after which it should be removed.  Wrap patch in a tissue and discard in the trash. Wash hands thoroughly with soap and water. 2. You may remove the patch earlier than 72 hours if you experience unpleasant side effects which may include dry mouth, dizziness or visual disturbances. 3. Avoid touching the patch. Wash your hands with soap and water after contact with the patch.

## 2024-03-23 NOTE — Op Note (Signed)
   DATE OF SURGERY: 03/23/2024  PREOPERATIVE DIAGNOSES:  Left recalcitrant de Quervain's tenosynovitis   POSTOPERATIVE DIAGNOSES:  1.  Severe tenosynovitis of left wrist APL and EPB tendons 2.  Same  PROCEDURES:  1.  Tenolysis of left wrist APL and EPB tendons x 6 of the first dorsal wrist compartment.  CPT [id number].  SURGEON: Kay Cummins, M.D.  ASSIST: Ronal Jacobsen [place], [place]; necessary for the timely completion of procedure and due to complexity of procedure.  ANESTHESIA: General.   TOURNIQUET TIME: 20 minutes  BLOOD LOSS: Minimal.   COMPLICATIONS: None.   PATHOLOGY: None.   TIME OUT: A time out was performed before the procedure started.   INDICATIONS: The patient is a 53 y.o. female who presented with recalcitrant de Quervain's indicated for surgery.   DESCRIPTION OF PROCEDURE:  The patient was placed in the supine position. Prophylactic antibiotics were administered prior to incision.  General anesthesia was administered.  Nonsterile tourniquet was placed on the upper arm.  The extremity was prepped and draped in standard sterile fashion.  Tourniquet was inflated to 250 mmHg.  A radially based incision over the radial styloid was used.  The superficial radial nerve and its branches was identified, mobilized and protected.  Gross bleeders were cauterized with bipolar.  Immediately I found severe tenosynovitis of the APL and EPB tendons and their multiple slips.  There was a fair amount of fluid within the compartment.  Then the distal edge of the first dorsal extensor retinaculum was identified. The retinaculum was released over its dorsal edge. The extensor pollicis brevis tendon was identified. There was a separate thick septum in between the EPB and abductor pollicis longus tendons. The septum was excised. The abductor pollicis longus tendon slips were examined. All tendons were freed from the compression.  Tenolysis was then carefully carried out on each slip of the APL and EPB  tendons which totaled 6.  The tendons themselves did not show any tendinopathy.  The tenosynovium was sent for pathology.  After a thorough tenolysis the surgical site was thoroughly irrigated and local infiltration with 0.25% of Sensorcaine  was given. Tourniquet was released. Hemostasis achieved. Wound was irrigated. Incision closed using 4-0 nylon sutures. Sterile dressing applied and hand immobilized in a thumb spica splint. The patient was transferred to recovery room in stable condition after all counts were correct.   POSTOPERATIVE PLAN: To start thumb range of motion exercises and wear brace for two weeks, avoid heavy lifting, pushing, pinching for four weeks.

## 2024-03-24 ENCOUNTER — Encounter (HOSPITAL_BASED_OUTPATIENT_CLINIC_OR_DEPARTMENT_OTHER): Payer: Self-pay | Admitting: Orthopaedic Surgery

## 2024-03-24 NOTE — Telephone Encounter (Signed)
 Completed and faxed.

## 2024-03-25 LAB — SURGICAL PATHOLOGY

## 2024-03-29 ENCOUNTER — Encounter

## 2024-03-30 ENCOUNTER — Ambulatory Visit (INDEPENDENT_AMBULATORY_CARE_PROVIDER_SITE_OTHER)

## 2024-03-30 ENCOUNTER — Other Ambulatory Visit (HOSPITAL_COMMUNITY)
Admission: RE | Admit: 2024-03-30 | Discharge: 2024-03-30 | Disposition: A | Discharge status: Home / Self Care | Payer: Self-pay | Source: Ambulatory Visit | Attending: Medical Genetics | Admitting: Medical Genetics

## 2024-03-30 DIAGNOSIS — J309 Allergic rhinitis, unspecified: Secondary | ICD-10-CM

## 2024-04-05 LAB — GENECONNECT MOLECULAR SCREEN: Genetic Analysis Overall Interpretation: NEGATIVE

## 2024-04-06 ENCOUNTER — Telehealth: Payer: Self-pay

## 2024-04-06 ENCOUNTER — Ambulatory Visit (INDEPENDENT_AMBULATORY_CARE_PROVIDER_SITE_OTHER): Payer: Self-pay

## 2024-04-06 ENCOUNTER — Ambulatory Visit (INDEPENDENT_AMBULATORY_CARE_PROVIDER_SITE_OTHER): Admitting: Physician Assistant

## 2024-04-06 DIAGNOSIS — J309 Allergic rhinitis, unspecified: Secondary | ICD-10-CM | POA: Diagnosis not present

## 2024-04-06 DIAGNOSIS — M654 Radial styloid tenosynovitis [de Quervain]: Secondary | ICD-10-CM

## 2024-04-06 MED ORDER — OXYCODONE HCL 5 MG PO TABS: 5.0000 mg | ORAL_TABLET | Freq: Two times a day (BID) | ORAL | 0 refills | Status: AC | PRN

## 2024-04-06 NOTE — Telephone Encounter (Signed)
 Patient called triage today. She states that the disability papers were never received when faxed. I see where you completed and faxed them. Can you look into this for her please?

## 2024-04-06 NOTE — Progress Notes (Signed)
 Post-Op Visit Note   Patient: Debbie Delgado           Date of Birth: August 17, 1970           MRN: [id number] Visit Date: 04/06/2024 PCP: Leavy Waddell NOVAK, FNP   Assessment & Plan:  Chief Complaint:  Chief Complaint  Patient presents with   Left Wrist - Follow-up    DeQuervains release 03/23/2024   Visit Diagnoses:  1. Tenosynovitis, de Quervain     Plan: Patient is a 53 year old female who comes in today 2 weeks status post left wrist de Quervain's release, date of surgery 03/23/2024.  She has been doing okay.  She has been compliant wearing her splint.  She has been taking oxycodone  for pain.  Examination of her left wrist reveals a well-healed surgical incision with nylon sutures in place.  No evidence of infection or cellulitis.  She does have decreased sensation to the thumb and dorsum of the hand.  Today, sutures were removed and Steri-Strips applied.  I provided her with a Velcro thumb spica splint to wear for the next 2 weeks.  She will avoid any pinching pulling pushing for the next 2 weeks.  Follow-up in 2 weeks for recheck.  Call with concerns or questions.  Follow-Up Instructions: Return in about 2 weeks (around 04/20/2024).   Orders:  No orders of the defined types were placed in this encounter.  Meds ordered this encounter  Medications   oxyCODONE  (ROXICODONE ) 5 MG immediate release tablet    Sig: Take 1 tablet (5 mg total) by mouth 2 (two) times daily as needed.    Dispense:  10 tablet    Refill:  0    Imaging: No results found.  PMFS History: Patient Active Problem List   Diagnosis Date Noted   Radial styloid tenosynovitis of left hand 03/23/2024   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-sided sciatica 08/25/2023   Closed displaced trimalleolar fracture of right lower leg 04/25/2023   Anemia 06/05/2017   Abnormal uterine bleeding (AUB) 06/05/2017   Past Medical History:  Diagnosis Date   Cancer (HCC)    cervical   Cervical cancer (HCC)     Complication of anesthesia    hard to wake hard to put out   Ectopic pregnancy    Endometriosis    Fibroid     Family History  Problem Relation Age of Onset   Allergic rhinitis Sister    Breast cancer Maternal Grandmother    Allergic rhinitis Brother    Angioedema Neg Hx    Asthma Neg Hx    Atopy Neg Hx    Eczema Neg Hx    Immunodeficiency Neg Hx    Urticaria Neg Hx     Past Surgical History:  Procedure Laterality Date   BREAST BIOPSY Right 12/08/2022   MM RT BREAST BX W LOC DEV 1ST LESION IMAGE BX SPEC STEREO GUIDE 12/08/2022 [facility]   BREAST BIOPSY  02/16/2023   MM RT RADIOACTIVE SEED LOC MAMMO GUIDE 02/16/2023 [facility]   BREAST LUMPECTOMY WITH RADIOACTIVE SEED LOCALIZATION Right 02/17/2023   Procedure: RIGHT BREAST LUMPECTOMY WITH RADIOACTIVE SEED LOCALIZATION;  Surgeon: Vanderbilt Ned, MD;  Location: [facility];  Service: General;  Laterality: Right;   CARPAL TUNNEL RELEASE     ORIF ANKLE FRACTURE Right 05/06/2023   Procedure: OPEN REDUCTION INTERNAL FIXATION (ORIF) RIGHT TRIMALLEOLAR FRACTURE;  Surgeon: Jerri Kay HERO, MD;  Location: [facility];  Service: Orthopedics;  Laterality: Right;  block   REPAIR EXTENSOR TENDON Left 03/23/2024   Procedure: Left Wrist Tenosynovectomy;  Surgeon: Jerri Kay HERO, MD;  Location: [facility];  Service: Orthopedics;  Laterality: Left;  LEFT DE QUERVAIN'S RELEASE   TONSILLECTOMY     Social History   Occupational History   Not on file  Tobacco Use   Smoking status: Every Day    Current packs/day: 0.50    Types: Cigarettes   Smokeless tobacco: Never   Tobacco comments:    1/2 PPD  Vaping Use   Vaping status: Never Used  Substance and Sexual Activity   Alcohol use: Yes    Comment: occasionally   Drug use: No   Sexual activity: Not on file

## 2024-04-06 NOTE — Telephone Encounter (Signed)
 IC,lmvm advised forms originally faxed 8/28. I refaxed [phone]

## 2024-04-13 ENCOUNTER — Ambulatory Visit (INDEPENDENT_AMBULATORY_CARE_PROVIDER_SITE_OTHER): Payer: Self-pay

## 2024-04-13 DIAGNOSIS — J309 Allergic rhinitis, unspecified: Secondary | ICD-10-CM | POA: Diagnosis not present

## 2024-04-19 ENCOUNTER — Ambulatory Visit (INDEPENDENT_AMBULATORY_CARE_PROVIDER_SITE_OTHER): Admitting: Orthopaedic Surgery

## 2024-04-19 ENCOUNTER — Encounter: Payer: Self-pay | Admitting: Orthopaedic Surgery

## 2024-04-19 DIAGNOSIS — M654 Radial styloid tenosynovitis [de Quervain]: Secondary | ICD-10-CM

## 2024-04-19 NOTE — Progress Notes (Signed)
 Post-Op Visit Note   Patient: Debbie Delgado           Date of Birth: November 04, 1970           MRN: [id number] Visit Date: 04/19/2024 PCP: Leavy Waddell NOVAK, FNP   Assessment & Plan:  Chief Complaint:  Chief Complaint  Patient presents with   Left Wrist - Follow-up    DeQuervains release 03/23/2024   Visit Diagnoses:  1. Radial styloid tenosynovitis of left hand     Plan: History of Present Illness Debbie Delgado is a 53 year old female who presents for follow-up after Everitt Curt release surgery.  She is four weeks post-surgery and has been removing the brace and massaging the scar, which she finds beneficial. She has not started formal therapy but is performing home exercises similar to her previous regimen for the right side, utilizing stretch bands and other equipment.  Physical Exam MUSCULOSKELETAL: Incision on hand is well-healed with good range of motion.  Range of motion is progressing nicely without pain or superficial radial nerve symptoms.  Assessment and Plan Status post left De Quervain's release Four weeks post-surgery with well-healed incision, good range of motion, and function. - Continue home exercises for left wrist using stretch bands and other equipment.  Follow-Up Instructions: Return if symptoms worsen or fail to improve.   Orders:  No orders of the defined types were placed in this encounter.  No orders of the defined types were placed in this encounter.   Imaging: No results found.  PMFS History: Patient Active Problem List   Diagnosis Date Noted   Radial styloid tenosynovitis of left hand 03/23/2024   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-sided sciatica 08/25/2023   Closed displaced trimalleolar fracture of right lower leg 04/25/2023   Anemia 06/05/2017   Abnormal uterine bleeding (AUB) 06/05/2017   Past Medical History:  Diagnosis Date   Cancer (HCC)    cervical   Cervical cancer (HCC)    Complication of anesthesia    hard to  wake hard to put out   Ectopic pregnancy    Endometriosis    Fibroid     Family History  Problem Relation Age of Onset   Allergic rhinitis Sister    Breast cancer Maternal Grandmother    Allergic rhinitis Brother    Angioedema Neg Hx    Asthma Neg Hx    Atopy Neg Hx    Eczema Neg Hx    Immunodeficiency Neg Hx    Urticaria Neg Hx     Past Surgical History:  Procedure Laterality Date   BREAST BIOPSY Right 12/08/2022   MM RT BREAST BX W LOC DEV 1ST LESION IMAGE BX SPEC STEREO GUIDE 12/08/2022 [facility]   BREAST BIOPSY  02/16/2023   MM RT RADIOACTIVE SEED LOC MAMMO GUIDE 02/16/2023 [facility]   BREAST LUMPECTOMY WITH RADIOACTIVE SEED LOCALIZATION Right 02/17/2023   Procedure: RIGHT BREAST LUMPECTOMY WITH RADIOACTIVE SEED LOCALIZATION;  Surgeon: Vanderbilt Ned, MD;  Location: [facility];  Service: General;  Laterality: Right;   CARPAL TUNNEL RELEASE     ORIF ANKLE FRACTURE Right 05/06/2023   Procedure: OPEN REDUCTION INTERNAL FIXATION (ORIF) RIGHT TRIMALLEOLAR FRACTURE;  Surgeon: Jerri Kay HERO, MD;  Location: [facility];  Service: Orthopedics;  Laterality: Right;  block   REPAIR EXTENSOR TENDON Left 03/23/2024   Procedure: Left Wrist Tenosynovectomy;  Surgeon: Jerri Kay HERO, MD;  Location: [facility];  Service: Orthopedics;  Laterality: Left;  LEFT  DE QUERVAIN'S RELEASE   TONSILLECTOMY     Social History   Occupational History   Not on file  Tobacco Use   Smoking status: Every Day    Current packs/day: 0.50    Types: Cigarettes   Smokeless tobacco: Never   Tobacco comments:    1/2 PPD  Vaping Use   Vaping status: Never Used  Substance and Sexual Activity   Alcohol use: Yes    Comment: occasionally   Drug use: No   Sexual activity: Not on file

## 2024-04-19 NOTE — Telephone Encounter (Signed)
 yes

## 2024-04-19 NOTE — Telephone Encounter (Signed)
Can she return to work?

## 2024-04-20 ENCOUNTER — Ambulatory Visit (INDEPENDENT_AMBULATORY_CARE_PROVIDER_SITE_OTHER)

## 2024-04-20 DIAGNOSIS — J309 Allergic rhinitis, unspecified: Secondary | ICD-10-CM | POA: Diagnosis not present

## 2024-04-27 ENCOUNTER — Ambulatory Visit: Payer: Self-pay

## 2024-04-27 DIAGNOSIS — J309 Allergic rhinitis, unspecified: Secondary | ICD-10-CM | POA: Diagnosis not present

## 2024-05-04 ENCOUNTER — Ambulatory Visit (INDEPENDENT_AMBULATORY_CARE_PROVIDER_SITE_OTHER)

## 2024-05-04 DIAGNOSIS — J309 Allergic rhinitis, unspecified: Secondary | ICD-10-CM

## 2024-05-04 MED ORDER — EPINEPHRINE 0.3 MG/0.3ML IJ SOAJ: 0.3000 mg | INTRAMUSCULAR | 1 refills | Status: AC | PRN

## 2024-05-11 ENCOUNTER — Ambulatory Visit (INDEPENDENT_AMBULATORY_CARE_PROVIDER_SITE_OTHER): Payer: Self-pay

## 2024-05-11 DIAGNOSIS — J309 Allergic rhinitis, unspecified: Secondary | ICD-10-CM

## 2024-05-20 ENCOUNTER — Ambulatory Visit (INDEPENDENT_AMBULATORY_CARE_PROVIDER_SITE_OTHER): Payer: Self-pay

## 2024-05-20 DIAGNOSIS — J309 Allergic rhinitis, unspecified: Secondary | ICD-10-CM | POA: Diagnosis not present

## 2024-05-25 ENCOUNTER — Ambulatory Visit (INDEPENDENT_AMBULATORY_CARE_PROVIDER_SITE_OTHER): Payer: Self-pay

## 2024-05-25 DIAGNOSIS — J309 Allergic rhinitis, unspecified: Secondary | ICD-10-CM

## 2024-05-30 ENCOUNTER — Encounter: Payer: Self-pay | Admitting: Radiology

## 2024-06-01 ENCOUNTER — Ambulatory Visit (INDEPENDENT_AMBULATORY_CARE_PROVIDER_SITE_OTHER): Payer: Self-pay

## 2024-06-01 DIAGNOSIS — J309 Allergic rhinitis, unspecified: Secondary | ICD-10-CM

## 2024-06-08 ENCOUNTER — Ambulatory Visit (INDEPENDENT_AMBULATORY_CARE_PROVIDER_SITE_OTHER)

## 2024-06-08 DIAGNOSIS — J309 Allergic rhinitis, unspecified: Secondary | ICD-10-CM | POA: Diagnosis not present

## 2024-06-15 ENCOUNTER — Ambulatory Visit (INDEPENDENT_AMBULATORY_CARE_PROVIDER_SITE_OTHER)

## 2024-06-15 DIAGNOSIS — J309 Allergic rhinitis, unspecified: Secondary | ICD-10-CM | POA: Diagnosis not present

## 2024-06-22 ENCOUNTER — Ambulatory Visit (INDEPENDENT_AMBULATORY_CARE_PROVIDER_SITE_OTHER)

## 2024-06-22 DIAGNOSIS — J309 Allergic rhinitis, unspecified: Secondary | ICD-10-CM | POA: Diagnosis not present

## 2024-06-29 ENCOUNTER — Ambulatory Visit (INDEPENDENT_AMBULATORY_CARE_PROVIDER_SITE_OTHER)

## 2024-06-29 DIAGNOSIS — J309 Allergic rhinitis, unspecified: Secondary | ICD-10-CM | POA: Diagnosis not present

## 2024-07-06 ENCOUNTER — Ambulatory Visit (INDEPENDENT_AMBULATORY_CARE_PROVIDER_SITE_OTHER)

## 2024-07-06 DIAGNOSIS — J309 Allergic rhinitis, unspecified: Secondary | ICD-10-CM | POA: Diagnosis not present

## 2024-07-13 ENCOUNTER — Ambulatory Visit (INDEPENDENT_AMBULATORY_CARE_PROVIDER_SITE_OTHER)

## 2024-07-13 DIAGNOSIS — J309 Allergic rhinitis, unspecified: Secondary | ICD-10-CM | POA: Diagnosis not present

## 2024-07-27 ENCOUNTER — Ambulatory Visit (INDEPENDENT_AMBULATORY_CARE_PROVIDER_SITE_OTHER)

## 2024-07-27 DIAGNOSIS — J309 Allergic rhinitis, unspecified: Secondary | ICD-10-CM

## 2024-07-27 DIAGNOSIS — J3089 Other allergic rhinitis: Secondary | ICD-10-CM

## 2024-08-03 ENCOUNTER — Ambulatory Visit

## 2024-08-03 DIAGNOSIS — J302 Other seasonal allergic rhinitis: Secondary | ICD-10-CM | POA: Diagnosis not present

## 2024-08-10 ENCOUNTER — Ambulatory Visit (INDEPENDENT_AMBULATORY_CARE_PROVIDER_SITE_OTHER)

## 2024-08-10 DIAGNOSIS — J302 Other seasonal allergic rhinitis: Secondary | ICD-10-CM

## 2024-08-17 ENCOUNTER — Ambulatory Visit

## 2024-08-17 DIAGNOSIS — J302 Other seasonal allergic rhinitis: Secondary | ICD-10-CM | POA: Diagnosis not present

## 2024-08-26 ENCOUNTER — Ambulatory Visit

## 2024-08-26 DIAGNOSIS — J302 Other seasonal allergic rhinitis: Secondary | ICD-10-CM
